# Patient Record
Sex: Female | Born: 1975 | Race: Black or African American | Hispanic: No | Marital: Single | Smoking: Never smoker
Health system: Southern US, Community
[De-identification: ages and names within clinical notes are randomized; demographics above are authoritative.]

## PROBLEM LIST (undated history)

## (undated) DIAGNOSIS — I252 Old myocardial infarction: Secondary | ICD-10-CM

## (undated) DIAGNOSIS — D571 Sickle-cell disease without crisis: Secondary | ICD-10-CM

## (undated) DIAGNOSIS — I2699 Other pulmonary embolism without acute cor pulmonale: Secondary | ICD-10-CM

## (undated) DIAGNOSIS — R569 Unspecified convulsions: Secondary | ICD-10-CM

## (undated) HISTORY — PX: OTHER SURGICAL HISTORY: SHX169

## (undated) HISTORY — PX: CORONARY ANGIOPLASTY WITH STENT PLACEMENT: SHX49

---

## 2012-11-04 DIAGNOSIS — I2510 Atherosclerotic heart disease of native coronary artery without angina pectoris: Secondary | ICD-10-CM | POA: Diagnosis present

## 2014-05-12 DIAGNOSIS — Z8669 Personal history of other diseases of the nervous system and sense organs: Secondary | ICD-10-CM | POA: Insufficient documentation

## 2014-12-03 DIAGNOSIS — G40909 Epilepsy, unspecified, not intractable, without status epilepticus: Secondary | ICD-10-CM

## 2015-09-25 DIAGNOSIS — I10 Essential (primary) hypertension: Secondary | ICD-10-CM | POA: Diagnosis present

## 2016-01-02 DIAGNOSIS — I2699 Other pulmonary embolism without acute cor pulmonale: Secondary | ICD-10-CM | POA: Insufficient documentation

## 2016-09-06 DIAGNOSIS — M459 Ankylosing spondylitis of unspecified sites in spine: Secondary | ICD-10-CM | POA: Insufficient documentation

## 2020-02-22 DIAGNOSIS — R112 Nausea with vomiting, unspecified: Secondary | ICD-10-CM | POA: Insufficient documentation

## 2021-07-20 ENCOUNTER — Observation Stay
Admission: EM | Admit: 2021-07-20 | Discharge: 2021-07-22 | Disposition: A | Discharge status: Home / Self Care | Payer: Managed Care, Other (non HMO) | Attending: Internal Medicine | Admitting: Internal Medicine

## 2021-07-20 ENCOUNTER — Emergency Department: Payer: Managed Care, Other (non HMO)

## 2021-07-20 DIAGNOSIS — Z7289 Other problems related to lifestyle: Secondary | ICD-10-CM | POA: Diagnosis not present

## 2021-07-20 DIAGNOSIS — Z7982 Long term (current) use of aspirin: Secondary | ICD-10-CM | POA: Diagnosis not present

## 2021-07-20 DIAGNOSIS — D649 Anemia, unspecified: Secondary | ICD-10-CM

## 2021-07-20 DIAGNOSIS — N200 Calculus of kidney: Secondary | ICD-10-CM | POA: Diagnosis not present

## 2021-07-20 DIAGNOSIS — R0789 Other chest pain: Secondary | ICD-10-CM | POA: Insufficient documentation

## 2021-07-20 DIAGNOSIS — K259 Gastric ulcer, unspecified as acute or chronic, without hemorrhage or perforation: Principal | ICD-10-CM | POA: Insufficient documentation

## 2021-07-20 DIAGNOSIS — I10 Essential (primary) hypertension: Secondary | ICD-10-CM | POA: Insufficient documentation

## 2021-07-20 DIAGNOSIS — I2510 Atherosclerotic heart disease of native coronary artery without angina pectoris: Secondary | ICD-10-CM | POA: Insufficient documentation

## 2021-07-20 DIAGNOSIS — K2970 Gastritis, unspecified, without bleeding: Secondary | ICD-10-CM | POA: Insufficient documentation

## 2021-07-20 DIAGNOSIS — R079 Chest pain, unspecified: Secondary | ICD-10-CM | POA: Diagnosis present

## 2021-07-20 DIAGNOSIS — I252 Old myocardial infarction: Secondary | ICD-10-CM | POA: Diagnosis not present

## 2021-07-20 DIAGNOSIS — Z86711 Personal history of pulmonary embolism: Secondary | ICD-10-CM | POA: Diagnosis not present

## 2021-07-20 DIAGNOSIS — D509 Iron deficiency anemia, unspecified: Secondary | ICD-10-CM | POA: Insufficient documentation

## 2021-07-20 DIAGNOSIS — Z79899 Other long term (current) drug therapy: Secondary | ICD-10-CM | POA: Diagnosis not present

## 2021-07-20 DIAGNOSIS — Z765 Malingerer [conscious simulation]: Secondary | ICD-10-CM

## 2021-07-20 DIAGNOSIS — G40909 Epilepsy, unspecified, not intractable, without status epilepticus: Secondary | ICD-10-CM

## 2021-07-20 DIAGNOSIS — I2699 Other pulmonary embolism without acute cor pulmonale: Secondary | ICD-10-CM | POA: Diagnosis present

## 2021-07-20 HISTORY — DX: Old myocardial infarction: I25.2

## 2021-07-20 HISTORY — DX: Other pulmonary embolism without acute cor pulmonale: I26.99

## 2021-07-20 HISTORY — DX: Sickle-cell disease without crisis: D57.1

## 2021-07-20 HISTORY — DX: Unspecified convulsions: R56.9

## 2021-07-20 LAB — CBC WITH DIFFERENTIAL/PLATELET
Abs Immature Granulocytes: 0.02 10*3/uL (ref 0.00–0.07)
Basophils Absolute: 0 10*3/uL (ref 0.0–0.1)
Basophils Relative: 1 %
Eosinophils Absolute: 0.1 10*3/uL (ref 0.0–0.5)
Eosinophils Relative: 1 %
HCT: 22.5 % — ABNORMAL LOW (ref 36.0–46.0)
Hemoglobin: 7.4 g/dL — ABNORMAL LOW (ref 12.0–15.0)
Immature Granulocytes: 0 %
Lymphocytes Relative: 24 %
Lymphs Abs: 1.4 10*3/uL (ref 0.7–4.0)
MCH: 25.6 pg — ABNORMAL LOW (ref 26.0–34.0)
MCHC: 32.9 g/dL (ref 30.0–36.0)
MCV: 77.9 fL — ABNORMAL LOW (ref 80.0–100.0)
Monocytes Absolute: 0.5 10*3/uL (ref 0.1–1.0)
Monocytes Relative: 8 %
Neutro Abs: 3.8 10*3/uL (ref 1.7–7.7)
Neutrophils Relative %: 66 %
Platelets: 115 10*3/uL — ABNORMAL LOW (ref 150–400)
RBC: 2.89 MIL/uL — ABNORMAL LOW (ref 3.87–5.11)
RDW: 19.7 % — ABNORMAL HIGH (ref 11.5–15.5)
WBC: 5.9 10*3/uL (ref 4.0–10.5)
nRBC: 0 % (ref 0.0–0.2)

## 2021-07-20 LAB — COMPREHENSIVE METABOLIC PANEL
ALT: 29 U/L (ref 0–44)
AST: 37 U/L (ref 15–41)
Albumin: 3.2 g/dL — ABNORMAL LOW (ref 3.5–5.0)
Alkaline Phosphatase: 101 U/L (ref 38–126)
Anion gap: 5 (ref 5–15)
BUN: 11 mg/dL (ref 6–20)
CO2: 22 mmol/L (ref 22–32)
Calcium: 8.7 mg/dL — ABNORMAL LOW (ref 8.9–10.3)
Chloride: 116 mmol/L — ABNORMAL HIGH (ref 98–111)
Creatinine, Ser: 0.97 mg/dL (ref 0.44–1.00)
GFR, Estimated: >60 mL/min (ref >60)
Glucose, Bld: 98 mg/dL (ref 70–99)
Potassium: 3.6 mmol/L (ref 3.5–5.1)
Sodium: 143 mmol/L (ref 135–145)
Total Bilirubin: 0.8 mg/dL (ref 0.3–1.2)
Total Protein: 6.1 g/dL — ABNORMAL LOW (ref 6.5–8.1)

## 2021-07-20 LAB — LACTIC ACID, PLASMA: Lactic Acid, Venous: 0.9 mmol/L (ref 0.5–1.9)

## 2021-07-20 LAB — PROTIME-INR
INR: 1.1 (ref 0.8–1.2)
Prothrombin Time: 14 seconds (ref 11.4–15.2)

## 2021-07-20 LAB — TROPONIN I (HIGH SENSITIVITY): Troponin I (High Sensitivity): 3 ng/L (ref <18)

## 2021-07-20 LAB — D-DIMER, QUANTITATIVE: D-Dimer, Quant: 0.43 ug/mL-FEU (ref 0.00–0.50)

## 2021-07-20 MED ORDER — SODIUM CHLORIDE 0.9 % IV BOLUS
500.0000 mL | Freq: Once | INTRAVENOUS | Status: AC
  Administered 2021-07-20: 500 mL via INTRAVENOUS

## 2021-07-20 MED ORDER — PANTOPRAZOLE SODIUM 40 MG IV SOLR
40.0000 mg | Freq: Two times a day (BID) | INTRAVENOUS | Status: DC
  Administered 2021-07-20 – 2021-07-22 (×4): 40 mg via INTRAVENOUS
  Filled 2021-07-20 (×4): qty 10

## 2021-07-20 MED ORDER — LEVETIRACETAM IN NACL 1000 MG/100ML IV SOLN
1000.0000 mg | Freq: Once | INTRAVENOUS | Status: AC
Start: 2021-07-20 — End: 2021-07-20
  Administered 2021-07-20: 1000 mg via INTRAVENOUS
  Filled 2021-07-20: qty 100

## 2021-07-20 MED ORDER — SODIUM CHLORIDE 0.9 % IV SOLN
10.0000 mL/h | Freq: Once | INTRAVENOUS | Status: AC
  Administered 2021-07-20: 10 mL/h via INTRAVENOUS

## 2021-07-20 MED ORDER — HYDROMORPHONE HCL 1 MG/ML IJ SOLN
0.5000 mg | Freq: Once | INTRAMUSCULAR | Status: AC
Start: 2021-07-20 — End: 2021-07-20
  Administered 2021-07-20: 0.5 mg via INTRAVENOUS
  Filled 2021-07-20: qty 0.5

## 2021-07-20 MED ORDER — IOHEXOL 300 MG/ML  SOLN
75.0000 mL | Freq: Once | INTRAMUSCULAR | Status: AC | PRN
  Administered 2021-07-20: 75 mL via INTRAVENOUS

## 2021-07-20 MED ORDER — HYDROMORPHONE HCL 1 MG/ML IJ SOLN
0.5000 mg | Freq: Once | INTRAMUSCULAR | Status: AC
  Administered 2021-07-20: 0.5 mg via INTRAVENOUS
  Filled 2021-07-20: qty 0.5

## 2021-07-20 NOTE — ED Provider Notes (Signed)
? ?[facility] ?Provider Note ? ? ? Event Date/Time  ? First MD Initiated Contact with Patient 07/20/21 2120   ?  (approximate) ? ? ?History  ? ?Chest Pain ? ? ?HPI ? ?Tammy Johnston is a 46 y.o. female  with complex PMHx including CAD s/p stenting to LAD, seizures, HTN, chronic/recurrent CP, question of sickle cell anemia, recent kidney stone with R sided stetn in place, here with chest pain. Pt reports that she is driving through town with her husband, who is a Naval architecttruck driver. Reports that today, she experienced acute, left sided CP with some radiation toward her L shoulder. She has had recurrent CP recently and is scheduled to see a Cardiologist at home in [place] soon. She reports she also has had persistent R sided abd/flank pain with nausea, which is also not new and likely related to her stent. Reports that she has fairly frequent CP, but this was worse than usual so she presents for evaluation. She has been taking her meds as prescribed. No fevers.  ? ?  ? ? ?Physical Exam  ? ?Triage Vital Signs: ?ED Triage Vitals  ?Enc Vitals Group  ?   BP   ?   Pulse   ?   Resp   ?   Temp   ?   Temp src   ?   SpO2   ?   Weight   ?   Height   ?   Head Circumference   ?   Peak Flow   ?   Pain Score   ?   Pain Loc   ?   Pain Edu?   ?   Excl. in GC?   ? ? ?Most recent vital signs: ?Vitals:  ? 07/20/21 2300 07/20/21 2345  ?BP: 112/74 (!) 132/97  ?Pulse: 73 77  ?Resp: 20 (!) 21  ?Temp:    ?SpO2: 99% 95%  ? ? ? ?General: Awake, no distress.  ?CV:  Good peripheral perfusion. RRR. No murmurs. ?Resp:  Normal effort. Lungs CTAB. ?Abd:  No distention. Minimal R sided TTP, no rebound, no guarding. No CVAT. ?Other:  Somewhat anxious/uncomfortable appearing. MMM.  ? ? ?ED Results / Procedures / Treatments  ? ?Labs ?(all labs ordered are listed, but only abnormal results are displayed) ?Labs Reviewed  ?CBC WITH DIFFERENTIAL/PLATELET - Abnormal; Notable for the following components:  ?    Result Value  ? RBC 2.89 (*)   ?  Hemoglobin 7.4 (*)   ? HCT 22.5 (*)   ? MCV 77.9 (*)   ? MCH 25.6 (*)   ? RDW 19.7 (*)   ? Platelets 115 (*)   ? All other components within normal limits  ?COMPREHENSIVE METABOLIC PANEL - Abnormal; Notable for the following components:  ? Chloride 116 (*)   ? Calcium 8.7 (*)   ? Total Protein 6.1 (*)   ? Albumin 3.2 (*)   ? All other components within normal limits  ?URINALYSIS, ROUTINE W REFLEX MICROSCOPIC - Abnormal; Notable for the following components:  ? Color, Urine YELLOW (*)   ? APPearance CLOUDY (*)   ? Hgb urine dipstick MODERATE (*)   ? Protein, ur 30 (*)   ? Nitrite POSITIVE (*)   ? Leukocytes,Ua LARGE (*)   ? WBC, UA >50 (*)   ? Bacteria, UA RARE (*)   ? All other components within normal limits  ?IRON AND TIBC - Abnormal; Notable for the following components:  ? Iron 18 (*)   ?  Saturation Ratios 5 (*)   ? All other components within normal limits  ?FERRITIN - Abnormal; Notable for the following components:  ? Ferritin 7 (*)   ? All other components within normal limits  ?RETICULOCYTES - Abnormal; Notable for the following components:  ? RBC. 2.99 (*)   ? Immature Retic Fract 28.6 (*)   ? All other components within normal limits  ?URINE DRUG SCREEN, QUALITATIVE ([facility] ONLY) - Abnormal; Notable for the following components:  ? Tricyclic, Ur Screen POSITIVE (*)   ? Opiate, Ur Screen POSITIVE (*)   ? Benzodiazepine, Ur Scrn POSITIVE (*)   ? All other components within normal limits  ?LACTIC ACID, PLASMA  ?LACTIC ACID, PLASMA  ?D-DIMER, QUANTITATIVE  ?PROTIME-INR  ?FOLATE  ?VITAMIN B12  ?SICKLE CELL SCREEN  ?OCCULT BLOOD X 1 CARD TO LAB, STOOL  ?HIV ANTIBODY (ROUTINE TESTING W REFLEX)  ?TSH  ?T4, FREE  ?LIPID PANEL  ?PREPARE RBC (CROSSMATCH)  ?TYPE AND SCREEN  ?TROPONIN I (HIGH SENSITIVITY)  ?TROPONIN I (HIGH SENSITIVITY)  ?TROPONIN I (HIGH SENSITIVITY)  ? ? ? ?EKG ?Normal sinus rhythm, VR 86. PR 149, QRS 88, QTc 454. No acute St elevations or depressions. No ischemia or infarct. ? ? ?RADIOLOGY ?CXR:  Clear ? ? ?I also independently reviewed and agree with radiologist interpretations. ? ? ?PROCEDURES: ? ?Critical Care performed: No ? ?.1-3 Lead EKG Interpretation ?Performed by: Shaune Pollack, MD ?Authorized by: Shaune Pollack, MD  ? ?  Interpretation: normal   ?  ECG rate:  80-90 ?  ECG rate assessment: normal   ?  Rhythm: sinus rhythm   ?  Ectopy: none   ?  Conduction: normal   ?Comments:  ?   Indication: Chest pain ? ? ? ?MEDICATIONS ORDERED IN ED: ?Medications  ?pantoprazole (PROTONIX) injection 40 mg (40 mg Intravenous Given 07/20/21 2345)  ?sodium chloride flush (NS) 0.9 % injection 3 mL (3 mLs Intravenous Not Given 07/21/21 0043)  ?sodium chloride flush (NS) 0.9 % injection 3 mL (has no administration in time range)  ?0.9 %  sodium chloride infusion (has no administration in time range)  ?aspirin EC tablet 81 mg (has no administration in time range)  ?nitroGLYCERIN (NITROSTAT) SL tablet 0.4 mg (has no administration in time range)  ?atorvastatin (LIPITOR) tablet 40 mg (has no administration in time range)  ?acetaminophen (TYLENOL) tablet 650 mg (has no administration in time range)  ?ondansetron (ZOFRAN) injection 4 mg (has no administration in time range)  ?heparin injection 5,000 Units (has no administration in time range)  ?levETIRAcetam (KEPPRA) IVPB 1000 mg/100 mL premix (0 mg Intravenous Stopped 07/20/21 2144)  ?HYDROmorphone (DILAUDID) injection 0.5 mg (0.5 mg Intravenous Given 07/20/21 2137)  ?sodium chloride 0.9 % bolus 500 mL (0 mLs Intravenous Stopped 07/20/21 2257)  ?HYDROmorphone (DILAUDID) injection 0.5 mg (0.5 mg Intravenous Given 07/20/21 2345)  ?0.9 %  sodium chloride infusion (10 mL/hr Intravenous New Bag/Given 07/20/21 2346)  ?iohexol (OMNIPAQUE) 300 MG/ML solution 75 mL (75 mLs Intravenous Contrast Given 07/20/21 2354)  ? ? ? ?IMPRESSION / MDM / ASSESSMENT AND PLAN / ED COURSE  ?I reviewed the triage vital signs and the nursing notes. ?             ?               ? ? ?The patient is on the  cardiac monitor to evaluate for evidence of arrhythmia and/or significant heart rate changes. ? ? ?Ddx:  ?Differential includes the following, with pertinent  life- or limb-threatening emergencies considered: ? ?ACS, PE, PNA, PTX, MSK chest pain, possible sickle cell related pain?, referred pain from renal stone ? ? ?MDM:  ?46 yo F with complex PMHx including CAD, seizure d/o, nephrolithiasis with renal stent in place, here with chest pain and abd pain. Re: abd pain - suspect this is 2/2 her stent. No fevers or infectious sx. UA pending. Re: chest pain - this is likely multifactorial. Pt has a long, well documented h/o intermittent CP, and has had LAD disease previously as well as chronic pain issues. Pt interestingly states she was just told she had "sickle cell," and Hgb is 7.4 today. No signs of bleeding clinically. This, along with her CP and h/o CAD, merits transfusion and observation for possible symptomatic anemia with cardiac ischemia. EKG shows no ST elevations. Trop negative. Renal function is normal. CXR is clear without focal abnormality. D-Dimer negative and pt is on Xarelto, doubt PE. ? ?Will admit to hospitalist service for transfusion, chest pain observation, pain control. ? ? ? ?MEDICATIONS GIVEN IN ED: ?Medications  ?pantoprazole (PROTONIX) injection 40 mg (40 mg Intravenous Given 07/20/21 2345)  ?sodium chloride flush (NS) 0.9 % injection 3 mL (3 mLs Intravenous Not Given 07/21/21 0043)  ?sodium chloride flush (NS) 0.9 % injection 3 mL (has no administration in time range)  ?0.9 %  sodium chloride infusion (has no administration in time range)  ?aspirin EC tablet 81 mg (has no administration in time range)  ?nitroGLYCERIN (NITROSTAT) SL tablet 0.4 mg (has no administration in time range)  ?atorvastatin (LIPITOR) tablet 40 mg (has no administration in time range)  ?acetaminophen (TYLENOL) tablet 650 mg (has no administration in time range)  ?ondansetron (ZOFRAN) injection 4 mg (has no administration  in time range)  ?heparin injection 5,000 Units (has no administration in time range)  ?levETIRAcetam (KEPPRA) IVPB 1000 mg/100 mL premix (0 mg Intravenous Stopped 07/20/21 2144)  ?HYDROmorphone (DILAUDID) inject

## 2021-07-20 NOTE — H&P (Signed)
?History and Physical  ? ? ?Patient: Tammy Johnston [id number] DOB: 12-Jun-1975 ?DOA: 07/20/2021 ?DOS: the patient was seen and examined on 07/21/2021 ?PCP: Pcp, No  ?Patient coming from: Home ? ?Chief Complaint:  ?Chief Complaint  ?Patient presents with  ? Chest Pain  ? ?HPI: Tammy Johnston is a 46 y.o. female with medical history significant of seizure disorder, CHF, heart disease, history of PE and DVT ankylosing spondylitis,, history of dysarthria with a stroke work-up that was negative for an MRI, chronic anemia.  Patient also has past medical history of epilepsy, pancreatitis, scoliosis, spondylosis. ?Care everywhere shows multiple emergency room visits for several different health systems.   ?Pt takes norco [facility] her [facility] . ?Pt's last colonoscopy was long time ago.  ?Pt has primary doctor [facility], [place] - Dr. Chucky May PA. ?Pt does not know why she has VTE. ?Pt has blood clots / Pt also had PE and is on xarelto.  ?Does not know how long she has been anemic.  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 ?Meds are with husband on truck.  ?Requesting diazepam for stuttering and takes at home along with propranolol. ?>>Chest pain: ?Duration: today.  ?Frequency:intermittent.  ?Location:left side.  ?Quality:sharp.  ?Rate:  9/10  ?Radiation:radiating to her left arm. ?Aggravating: walking  ?Alleviating:resting  ?Associated factors: ?SOB. ?Husband is truck driver and she came in for chest pain.  ?Dr. Wanda Plump horn is urology left ureteral stent. ?Dr. Rueben Bash is cardiology both are in [place].  ?Pt has h/o seizures. ?Pt has known kidney stone on right and is seeing urology for it.  ?Pt has h/o PE / DVT. ? ?Review of Systems  ?Cardiovascular:  Positive for chest pain.  ?Genitourinary:  Positive for flank pain.  ?All other systems reviewed and are negative. ? ?Past Medical History:  ?Diagnosis Date  ? MI, old   ? PE (pulmonary  thromboembolism) ([place])   ? Seizures ([place])   ? Sickle cell anemia ([facility])   ? ?Social History ?reports that she has never smoked. She has never used smokeless tobacco. She reports that she does not drink alcohol and does not use drugs.  ? ?Prior to Admission Medications:  ?I have utilized all available immediate resources to obtain, update, or review the patient's current medications. ?Prior to Admission medications  ?Medication Sig Start Date End Date Taking? Authorizing Provider  ?HYDROcodone-acetaminophen (NORCO) 10-325 mg per tablet Take 1-2 tablets by mouth every 4 (four) hours as needed for mild pain or moderate pain Yes [provider]  ?aspirin delayed release 81 mg tablet Take 1 tablet (81 mg total) by mouth daily with breakfast 06/17/20 06/17/21 Laray Anger., MD  ?AtorvaSTATin (LIPITOR) 40 mg tablet Take 1 tablet (40 mg total) by mouth bedtime 06/16/20 06/16/21 Laray Anger., MD  ?FLUoxetine (PROzac) 40 mg capsule Take 80 mg by mouth daily [provider]  ?imipramine (TOFRANIL) 25 mg tablet Take 25 mg by mouth bedtime [provider]  ?levETIRAcetam (KEPPRA) 750 mg tablet Take 1,500 mg by mouth 2 (two) times a day. [provider]  ?mirtazapine (REMERON) 45 mg tablet Take 45 mg by mouth bedtime [provider]  ?prazosin (MINIPRESS) 5 mg capsule Take 5 mg by mouth bedtime [provider]  ?zolpidem (AMBIEN) 5 mg tablet Take 5 mg by mouth nightly as needed [provider]  ? ?Allergies:  ?is allergic to ketorolac tromethamine, ketorolac, tramadol, ginger,  morphine, and tramadol. ? ? ?Family History  ?Problem Relation Age of Onset  ? Coronary artery disease Mother  ? Cancer Mother  ? ?Physical Exam: ?Vitals:  ? 07/20/21 2200 07/20/21 2230 07/20/21 2300 07/20/21 2345  ?BP: 128/89 130/87 112/74 (!) 132/97  ?Pulse: 89 84 73 77  ?Resp: (!) 24 14 20  (!) 21  ?Temp:      ?TempSrc:      ?SpO2: 100% 100% 99% 95%  ?Weight:      ? ?Physical  Exam ?Vitals and nursing note reviewed.  ?Constitutional:   ?   General: She is not in acute distress. ?   Appearance: Normal appearance. She is not ill-appearing, toxic-appearing or diaphoretic.  ?HENT:  ?   Head: Normocephalic and atraumatic.  ?   Right Ear: Hearing and external ear normal.  ?   Left Ear: Hearing and external ear normal.  ?   Nose: Nose normal. No nasal deformity.  ?   Mouth/Throat:  ?   Lips: Pink.  ?   Mouth: Mucous membranes are moist.  ?   Tongue: No lesions.  ?   Pharynx: Oropharynx is clear.  ?Eyes:  ?   Extraocular Movements: Extraocular movements intact.  ?   Pupils: Pupils are equal, round, and reactive to light.  ?Neck:  ?   Vascular: No carotid bruit.  ?Cardiovascular:  ?   Rate and Rhythm: Normal rate and regular rhythm.  ?   Pulses: Normal pulses.  ?   Heart sounds: Normal heart sounds.  ?Pulmonary:  ?   Effort: Pulmonary effort is normal.  ?   Breath sounds: Normal breath sounds.  ?Abdominal:  ?   General: Bowel sounds are normal. There is no distension.  ?   Palpations: Abdomen is soft. There is no mass.  ?   Tenderness: There is no abdominal tenderness. There is no guarding.  ?   Hernia: No hernia is present.  ?Genitourinary: ?   Rectum: Guaiac result negative.  ?Musculoskeletal:  ?   Right lower leg: No edema.  ?   Left lower leg: No edema.  ?Skin: ?   General: Skin is warm.  ?Neurological:  ?   General: No focal deficit present.  ?   Mental Status: She is alert and oriented to person, place, and time.  ?   Cranial Nerves: Cranial nerves 2-12 are intact. No cranial nerve deficit.  ?   Motor: Motor function is intact. No weakness.  ?Psychiatric:     ?   Attention and Perception: Attention normal.     ?   Mood and Affect: Mood is anxious.     ?   Speech: Speech normal.     ?   Behavior: Behavior is cooperative.     ?   Cognition and Memory: Cognition normal.  ? ? ?Data Reviewed: ?Results for orders placed or performed during the hospital encounter of 07/20/21 (from the past 24  hour(s))  ?CBC with Differential     Status: Abnormal  ? Collection Time: 07/20/21  9:36 PM  ?Result Value Ref Range  ? WBC 5.9 4.0 - 10.5 K/uL  ? RBC 2.89 (L) 3.87 - 5.11 MIL/uL  ? Hemoglobin 7.4 (L) 12.0 - 15.0 g/dL  ? HCT 22.5 (L) 36.0 - 46.0 %  ? MCV 77.9 (L) 80.0 - 100.0 fL  ? MCH 25.6 (L) 26.0 - 34.0 pg  ? MCHC 32.9 30.0 - 36.0 g/dL  ? RDW 19.7 (H) 11.5 - 15.5 %  ?  Platelets 115 (L) 150 - 400 K/uL  ? nRBC 0.0 0.0 - 0.2 %  ? Neutrophils Relative % 66 %  ? Neutro Abs 3.8 1.7 - 7.7 K/uL  ? Lymphocytes Relative 24 %  ? Lymphs Abs 1.4 0.7 - 4.0 K/uL  ? Monocytes Relative 8 %  ? Monocytes Absolute 0.5 0.1 - 1.0 K/uL  ? Eosinophils Relative 1 %  ? Eosinophils Absolute 0.1 0.0 - 0.5 K/uL  ? Basophils Relative 1 %  ? Basophils Absolute 0.0 0.0 - 0.1 K/uL  ? Immature Granulocytes 0 %  ? Abs Immature Granulocytes 0.02 0.00 - 0.07 K/uL  ?Comprehensive metabolic panel     Status: Abnormal  ? Collection Time: 07/20/21  9:36 PM  ?Result Value Ref Range  ? Sodium 143 135 - 145 mmol/L  ? Potassium 3.6 3.5 - 5.1 mmol/L  ? Chloride 116 (H) 98 - 111 mmol/L  ? CO2 22 22 - 32 mmol/L  ? Glucose, Bld 98 70 - 99 mg/dL  ? BUN 11 6 - 20 mg/dL  ? Creatinine, Ser 0.97 0.44 - 1.00 mg/dL  ? Calcium 8.7 (L) 8.9 - 10.3 mg/dL  ? Total Protein 6.1 (L) 6.5 - 8.1 g/dL  ? Albumin 3.2 (L) 3.5 - 5.0 g/dL  ? AST 37 15 - 41 U/L  ? ALT 29 0 - 44 U/L  ? Alkaline Phosphatase 101 38 - 126 U/L  ? Total Bilirubin 0.8 0.3 - 1.2 mg/dL  ? GFR, Estimated >60 >60 mL/min  ? Anion gap 5 5 - 15  ?Lactic acid, plasma     Status: None  ? Collection Time: 07/20/21  9:36 PM  ?Result Value Ref Range  ? Lactic Acid, Venous 0.9 0.5 - 1.9 mmol/L  ?Troponin I (High Sensitivity)     Status: None  ? Collection Time: 07/20/21  9:36 PM  ?Result Value Ref Range  ? Troponin I (High Sensitivity) 3 <18 ng/L  ?D-dimer, quantitative     Status: None  ? Collection Time: 07/20/21  9:36 PM  ?Result Value Ref Range  ? D-Dimer, Quant 0.43 0.00 - 0.50 ug/mL-FEU  ?Protime-INR     Status:  None  ? Collection Time: 07/20/21  9:36 PM  ?Result Value Ref Range  ? Prothrombin Time 14.0 11.4 - 15.2 seconds  ? INR 1.1 0.8 - 1.2  ?CT scan chest with contrast: ?IMPRESSION: ?1. Mild right basilar linear atelectasis. ?

## 2021-07-20 NOTE — ED Triage Notes (Signed)
Pt arrived via EMS [place]. Pt sts that she has been having chest pain all day with radiating pain to the left arm. EMS administered nitro spray with nitro paste and aspirin. Pt still rates 8/10 chest pain. Pt has had four previous MI with stent placements to include the left LAD. ?

## 2021-07-21 ENCOUNTER — Other Ambulatory Visit: Payer: Self-pay

## 2021-07-21 ENCOUNTER — Observation Stay (HOSPITAL_BASED_OUTPATIENT_CLINIC_OR_DEPARTMENT_OTHER)
Admit: 2021-07-21 | Discharge: 2021-07-21 | Disposition: A | Discharge status: Home / Self Care | Payer: Managed Care, Other (non HMO) | Attending: Internal Medicine | Admitting: Internal Medicine

## 2021-07-21 ENCOUNTER — Encounter: Payer: Self-pay | Admitting: Internal Medicine

## 2021-07-21 ENCOUNTER — Observation Stay: Payer: Managed Care, Other (non HMO)

## 2021-07-21 DIAGNOSIS — I2510 Atherosclerotic heart disease of native coronary artery without angina pectoris: Secondary | ICD-10-CM

## 2021-07-21 DIAGNOSIS — R079 Chest pain, unspecified: Secondary | ICD-10-CM | POA: Diagnosis not present

## 2021-07-21 DIAGNOSIS — I2699 Other pulmonary embolism without acute cor pulmonale: Secondary | ICD-10-CM | POA: Diagnosis present

## 2021-07-21 DIAGNOSIS — I10 Essential (primary) hypertension: Secondary | ICD-10-CM

## 2021-07-21 DIAGNOSIS — R0789 Other chest pain: Secondary | ICD-10-CM

## 2021-07-21 DIAGNOSIS — D508 Other iron deficiency anemias: Secondary | ICD-10-CM | POA: Diagnosis not present

## 2021-07-21 DIAGNOSIS — G40909 Epilepsy, unspecified, not intractable, without status epilepticus: Secondary | ICD-10-CM

## 2021-07-21 LAB — URINALYSIS, ROUTINE W REFLEX MICROSCOPIC
Bilirubin Urine: NEGATIVE
Glucose, UA: NEGATIVE mg/dL
Ketones, ur: NEGATIVE mg/dL
Nitrite: POSITIVE — AB
Protein, ur: 30 mg/dL — AB
Specific Gravity, Urine: 1.021 (ref 1.005–1.030)
WBC, UA: >50 WBC/hpf — ABNORMAL HIGH (ref 0–5)
pH: 6 (ref 5.0–8.0)

## 2021-07-21 LAB — CBC
HCT: 31.7 % — ABNORMAL LOW (ref 36.0–46.0)
Hemoglobin: 10.7 g/dL — ABNORMAL LOW (ref 12.0–15.0)
MCH: 26.2 pg (ref 26.0–34.0)
MCHC: 33.8 g/dL (ref 30.0–36.0)
MCV: 77.5 fL — ABNORMAL LOW (ref 80.0–100.0)
Platelets: 111 10*3/uL — ABNORMAL LOW (ref 150–400)
RBC: 4.09 MIL/uL (ref 3.87–5.11)
RDW: 18.5 % — ABNORMAL HIGH (ref 11.5–15.5)
WBC: 7.8 10*3/uL (ref 4.0–10.5)
nRBC: 0 % (ref 0.0–0.2)

## 2021-07-21 LAB — IRON AND TIBC
Iron: 18 ug/dL — ABNORMAL LOW (ref 28–170)
Saturation Ratios: 5 % — ABNORMAL LOW (ref 10.4–31.8)
TIBC: 398 ug/dL (ref 250–450)
UIBC: 380 ug/dL

## 2021-07-21 LAB — ECHOCARDIOGRAM COMPLETE
AR max vel: 2.11 cm2
AV Area VTI: 1.89 cm2
AV Area mean vel: 2.03 cm2
AV Mean grad: 3 mmHg
AV Peak grad: 4.7 mmHg
Ao pk vel: 1.08 m/s
Area-P 1/2: 3.51 cm2
MV VTI: 2.25 cm2
S' Lateral: 2.84 cm
Weight: 2208 oz

## 2021-07-21 LAB — TROPONIN I (HIGH SENSITIVITY)
Troponin I (High Sensitivity): 3 ng/L (ref <18)
Troponin I (High Sensitivity): 3 ng/L (ref <18)
Troponin I (High Sensitivity): 3 ng/L (ref <18)

## 2021-07-21 LAB — FOLATE: Folate: 9.6 ng/mL (ref >5.9)

## 2021-07-21 LAB — RETICULOCYTES
Immature Retic Fract: 28.6 % — ABNORMAL HIGH (ref 2.3–15.9)
RBC.: 2.99 MIL/uL — ABNORMAL LOW (ref 3.87–5.11)
Retic Count, Absolute: 44.3 10*3/uL (ref 19.0–186.0)
Retic Ct Pct: 1.5 % (ref 0.4–3.1)

## 2021-07-21 LAB — LIPID PANEL
Cholesterol: 223 mg/dL — ABNORMAL HIGH (ref 0–200)
HDL: 58 mg/dL (ref >40)
LDL Cholesterol: 147 mg/dL — ABNORMAL HIGH (ref 0–99)
Total CHOL/HDL Ratio: 3.8 RATIO
Triglycerides: 90 mg/dL (ref <150)
VLDL: 18 mg/dL (ref 0–40)

## 2021-07-21 LAB — LACTIC ACID, PLASMA: Lactic Acid, Venous: 1 mmol/L (ref 0.5–1.9)

## 2021-07-21 LAB — URINE DRUG SCREEN, QUALITATIVE (ARMC ONLY)
Amphetamines, Ur Screen: NOT DETECTED
Barbiturates, Ur Screen: NOT DETECTED
Benzodiazepine, Ur Scrn: POSITIVE — AB
Cannabinoid 50 Ng, Ur ~~LOC~~: NOT DETECTED
Cocaine Metabolite,Ur ~~LOC~~: NOT DETECTED
MDMA (Ecstasy)Ur Screen: NOT DETECTED
Methadone Scn, Ur: NOT DETECTED
Opiate, Ur Screen: POSITIVE — AB
Phencyclidine (PCP) Ur S: NOT DETECTED
Tricyclic, Ur Screen: POSITIVE — AB

## 2021-07-21 LAB — PREPARE RBC (CROSSMATCH)

## 2021-07-21 LAB — ABO/RH: ABO/RH(D): B POS

## 2021-07-21 LAB — TSH: TSH: 0.647 u[IU]/mL (ref 0.350–4.500)

## 2021-07-21 LAB — T4, FREE: Free T4: 0.68 ng/dL (ref 0.61–1.12)

## 2021-07-21 LAB — HIV ANTIBODY (ROUTINE TESTING W REFLEX): HIV Screen 4th Generation wRfx: NONREACTIVE

## 2021-07-21 LAB — FERRITIN: Ferritin: 7 ng/mL — ABNORMAL LOW (ref 11–307)

## 2021-07-21 LAB — VITAMIN B12: Vitamin B-12: 330 pg/mL (ref 180–914)

## 2021-07-21 MED ORDER — LEVETIRACETAM 500 MG PO TABS
1500.0000 mg | ORAL_TABLET | Freq: Two times a day (BID) | ORAL | Status: DC
  Administered 2021-07-21 – 2021-07-22 (×3): 1500 mg via ORAL
  Filled 2021-07-21 (×3): qty 3

## 2021-07-21 MED ORDER — PEG 3350-KCL-NA BICARB-NACL 420 G PO SOLR
4000.0000 mL | Freq: Once | ORAL | Status: AC
  Administered 2021-07-21: 4000 mL via ORAL
  Filled 2021-07-21: qty 4000

## 2021-07-21 MED ORDER — SODIUM CHLORIDE 0.9% FLUSH
3.0000 mL | Freq: Two times a day (BID) | INTRAVENOUS | Status: DC
  Administered 2021-07-21 – 2021-07-22 (×2): 3 mL via INTRAVENOUS

## 2021-07-21 MED ORDER — ATORVASTATIN CALCIUM 20 MG PO TABS
40.0000 mg | ORAL_TABLET | Freq: Every day | ORAL | Status: DC
  Filled 2021-07-21: qty 2

## 2021-07-21 MED ORDER — DIAZEPAM 5 MG PO TABS
5.0000 mg | ORAL_TABLET | Freq: Once | ORAL | Status: AC
  Administered 2021-07-21: 5 mg via ORAL
  Filled 2021-07-21: qty 1

## 2021-07-21 MED ORDER — DIPHENHYDRAMINE HCL 50 MG/ML IJ SOLN
25.0000 mg | Freq: Once | INTRAMUSCULAR | Status: DC | PRN
  Filled 2021-07-21 (×2): qty 1

## 2021-07-21 MED ORDER — NITROGLYCERIN 0.4 MG SL SUBL: 0.4000 mg | SUBLINGUAL_TABLET | SUBLINGUAL | Status: DC | PRN

## 2021-07-21 MED ORDER — RIVAROXABAN 10 MG PO TABS
10.0000 mg | ORAL_TABLET | Freq: Every day | ORAL | Status: DC
  Filled 2021-07-21: qty 1

## 2021-07-21 MED ORDER — HEPARIN SODIUM (PORCINE) 5000 UNIT/ML IJ SOLN: 5000.0000 [IU] | Freq: Three times a day (TID) | INTRAMUSCULAR | Status: DC

## 2021-07-21 MED ORDER — ONDANSETRON HCL 4 MG/2ML IJ SOLN: 4.0000 mg | Freq: Four times a day (QID) | INTRAMUSCULAR | Status: DC | PRN

## 2021-07-21 MED ORDER — PRAZOSIN HCL 5 MG PO CAPS
5.0000 mg | ORAL_CAPSULE | Freq: Every day | ORAL | Status: DC
  Administered 2021-07-21: 5 mg via ORAL
  Filled 2021-07-21: qty 1

## 2021-07-21 MED ORDER — QUETIAPINE FUMARATE ER 300 MG PO TB24
300.0000 mg | ORAL_TABLET | Freq: Every day | ORAL | Status: DC
  Administered 2021-07-21: 300 mg via ORAL
  Filled 2021-07-21: qty 1

## 2021-07-21 MED ORDER — ZOLPIDEM TARTRATE 5 MG PO TABS
5.0000 mg | ORAL_TABLET | Freq: Once | ORAL | Status: AC
  Administered 2021-07-21: 5 mg via ORAL
  Filled 2021-07-21: qty 1

## 2021-07-21 MED ORDER — ACETAMINOPHEN 325 MG PO TABS: 650.0000 mg | ORAL_TABLET | ORAL | Status: DC | PRN

## 2021-07-21 MED ORDER — FLUOXETINE HCL 20 MG PO CAPS
80.0000 mg | ORAL_CAPSULE | Freq: Every day | ORAL | Status: DC
  Administered 2021-07-21 – 2021-07-22 (×2): 80 mg via ORAL
  Filled 2021-07-21 (×2): qty 4

## 2021-07-21 MED ORDER — HYDROMORPHONE HCL 1 MG/ML IJ SOLN
0.5000 mg | Freq: Once | INTRAMUSCULAR | Status: AC
  Administered 2021-07-21: 0.5 mg via INTRAVENOUS
  Filled 2021-07-21: qty 0.5

## 2021-07-21 MED ORDER — KETOROLAC TROMETHAMINE 15 MG/ML IJ SOLN
15.0000 mg | Freq: Once | INTRAMUSCULAR | Status: AC
  Administered 2021-07-21: 15 mg via INTRAVENOUS
  Filled 2021-07-21: qty 1

## 2021-07-21 MED ORDER — HYDROCODONE-ACETAMINOPHEN 10-325 MG PO TABS
1.0000 | ORAL_TABLET | Freq: Four times a day (QID) | ORAL | Status: DC | PRN
  Administered 2021-07-21 (×3): 1 via ORAL
  Filled 2021-07-21 (×3): qty 1

## 2021-07-21 MED ORDER — SODIUM CHLORIDE 0.9% FLUSH: 3.0000 mL | INTRAVENOUS | Status: DC | PRN

## 2021-07-21 MED ORDER — SODIUM CHLORIDE 0.9 % IV SOLN: 250.0000 mL | INTRAVENOUS | Status: DC | PRN

## 2021-07-21 MED ORDER — ASPIRIN EC 81 MG PO TBEC
81.0000 mg | DELAYED_RELEASE_TABLET | Freq: Every day | ORAL | Status: DC
  Administered 2021-07-21 – 2021-07-22 (×2): 81 mg via ORAL
  Filled 2021-07-21 (×2): qty 1

## 2021-07-21 MED ORDER — HYDROCODONE-ACETAMINOPHEN 10-325 MG PO TABS
1.0000 | ORAL_TABLET | ORAL | Status: DC | PRN
  Administered 2021-07-21: 1 via ORAL
  Administered 2021-07-22 (×3): 2 via ORAL
  Filled 2021-07-21: qty 2
  Filled 2021-07-21: qty 1
  Filled 2021-07-21 (×2): qty 2

## 2021-07-21 NOTE — Progress Notes (Signed)
? ?  ?Name: Rene KocherRegina Fukuda ?MRN: [id number] ?DOB: 09/18/1975 ? ?                                                                                                              CROSS COVER NOTE ?  ?Secure chat received from nursing "I told pt you are busy and are the only doctor for the entire hospital tonight, but pt states she would still like to speak to you, as she would like to have dilaudid because she has gallstones and this is the only medication that has worked for her pain. She says if you can't come up you can call her on her room phone " ?  ?M(r)s Kunin is a 46 yo F who [facility] ED on 07[facility]21 with reports of chest pain and RUQ abdominal pain. PMH seizure disorder, CHF, heart disease, hx of PE and DVT, ankylosing spondylitis, history of dysarthria, epilepsy, pancreatitis, scoliosis, spondylosis, and chronic anemia. Tonight patient is reporting 8/10 RUQ and (R) flank pain that is sharp and made worse with movement. Patient states she has kidney stones and is currently experiencing a flare of ankylosing spondylitis. She reports her pain is unrelieved by Norco. ? ?On review of CareEverywhere patient has been reporting similar pain since April 28 2021 at which time she presented to an [facility] facility in [place]. Ultrasound there showed no evidence of gallstones. On June 08 2021 she presented to a [facility] facility reporting RUQ pain and chest pain and left AMA. During this visit she reported she was told she had kidney stones during an ED visit 2 days prior in [place]. [facility] that on her review of the imaging from the [place] visit there was no evidence of any acute emergent pathology.  ? ?On initial interview of patient tonight I confirmed all listed allergies with patient and asked if she had any additional allergies which at that time she denied. After CT Abd/pelvis resulted I went back to patient's room and discussed the CT findings. I explained that at  this time there are no clinical findings to support escalation of pain regimen to IV dilaudid but I was happy to offer Oxycodone as an alternative to Norco, as well as lidocaine patches, and supportive therapy. At this time M(r)s Munshi reports she is unable to tolerate Oxycodone and that it causes her to pass out and causes hallucinations. She now reports she is not allergic to Toradol but it would not be effective in managing her current pain. M(r)s Markin reports only IV fentanyl, IV dilaudid, or IV morphine with IV benadryl will provide pain relief. ? ? ? ?Today's Vitals  ? 07/21/21 1933 07/21/21 2013 07/21/21 2015 07/21/21 2058  ?BP:   117/77   ?Pulse:   75   ?Resp:   18   ?Temp:   98.1 ?F (36.7 ?C)   ?TempSrc:   Oral   ?SpO2:   99%   ?Weight:      ?PainSc: 8  8   8    ? ?  There is no height or weight on file to calculate BMI. ? ?CBC ?   ?Component Value Date/Time  ? WBC 7.8 07/21/2021 1610  ? RBC 4.09 07/21/2021 1610  ? HGB 10.7 (L) 07/21/2021 1610  ? HCT 31.7 (L) 07/21/2021 1610  ? PLT 111 (L) 07/21/2021 1610  ? MCV 77.5 (L) 07/21/2021 1610  ? MCH 26.2 07/21/2021 1610  ? MCHC 33.8 07/21/2021 1610  ? RDW 18.5 (H) 07/21/2021 1610  ? LYMPHSABS 1.4 07/20/2021 2136  ? MONOABS 0.5 07/20/2021 2136  ? EOSABS 0.1 07/20/2021 2136  ? BASOSABS 0.0 07/20/2021 2136  ? ? ?  Latest Ref Rng & Units 07/20/2021  ?  9:36 PM  ?BMP  ?Glucose 70 - 99 mg/dL 98    ?BUN 6 - 20 mg/dL 11    ?Creatinine 0.44 - 1.00 mg/dL 1.61    ?Sodium 135 - 145 mmol/L 143    ?Potassium 3.5 - 5.1 mmol/L 3.6    ?Chloride 98 - 111 mmol/L 116    ?CO2 22 - 32 mmol/L 22    ?Calcium 8.9 - 10.3 mg/dL 8.7    ? ?Cardiac Panel (last 3 results) ?Recent Labs  ?  07/20/21 ?2322 07/21/21 ?0617 07/21/21 ?1610  ?TROPONINIHS 3 3 3   ? ?CT ABDOMEN PELVIS WO CONTRAST ? ?Result Date: 07/21/2021 ?CLINICAL DATA:  Flank pain EXAM: CT ABDOMEN AND PELVIS WITHOUT CONTRAST TECHNIQUE: Multidetector CT imaging of the abdomen and pelvis was performed following the standard protocol  without IV contrast. RADIATION DOSE REDUCTION: This exam was performed according to the departmental dose-optimization program which includes automated exposure control, adjustment of the mA and/or kV according to patient size and/or use of iterative reconstruction technique. COMPARISON:  None Available. FINDINGS: Lower chest: Mild linear scarring/atelectasis in the right lower lobe. Hepatobiliary: Unenhanced liver is unremarkable. Vicarious excretion of contrast in the gallbladder (series 2/image 26). No intrahepatic or extrahepatic ductal dilatation. Pancreas: Within normal limits. Spleen: Within normal limits. Adrenals/Urinary Tract: Adrenal glands are within normal limits. Kidneys are within normal limits. No hydronephrosis. Right double-pigtail ureteral stent. Proximal pigtail is in the proximal ureter at the L3 level. Distal pigtail satisfactorily positioned in the bladder. Calcification adjacent to the right ureter/ureteral stent is favored to be a pelvic phlebolith (series 2/image 52). Additional phleboliths on image 58 and in the pelvis bilaterally. Bladder is within normal limits. Stomach/Bowel: Stomach is within normal limits. No evidence of bowel obstruction. Normal appendix (series 2/image 81). No colonic wall thickening or inflammatory changes. Vascular/Lymphatic: No evidence of abdominal aortic aneurysm. No suspicious abdominopelvic lymphadenopathy. Reproductive: Uterus is within normal limits. No adnexal masses. Other: Small volume pelvic ascites. Musculoskeletal: Visualized osseous structures are within normal limits. IMPRESSION: Low-lying right ureteral stent. Proximal pigtail is in the proximal ureter at the L3 level. No hydronephrosis. Electronically Signed   By: 07/23/2021 M.D.   On: 07/21/2021 21:47  ? ?CT CHEST W CONTRAST ? ?Result Date: 07/21/2021 ?CLINICAL DATA:  Chest pain. EXAM: CT CHEST WITH CONTRAST TECHNIQUE: Multidetector CT imaging of the chest was performed during intravenous  contrast administration. RADIATION DOSE REDUCTION: This exam was performed according to the departmental dose-optimization program which includes automated exposure control, adjustment of the mA and/or kV according to patient size and/or use of iterative reconstruction technique. CONTRAST:  12mL OMNIPAQUE IOHEXOL 300 MG/ML  SOLN COMPARISON:  None Available. FINDINGS: Cardiovascular: No significant vascular findings. Normal heart size. A coronary artery stent is in place. A trace amount of pericardial fluid is seen. Mediastinum/Nodes: No enlarged  mediastinal, hilar, or axillary lymph nodes. Thyroid gland, trachea, and esophagus demonstrate no significant findings. Lungs/Pleura: Mild linear atelectasis is seen within the right lung base. There is no evidence of acute infiltrate, pleural effusion or pneumothorax. Upper Abdomen: No acute abnormality. Musculoskeletal: No chest wall abnormality. No acute or significant osseous findings. IMPRESSION: 1. Mild right basilar linear atelectasis. 2. Trace amount of pericardial fluid. Electronically Signed   By: Aram Candela M.D.   On: 07/21/2021 00:18  ? ?ECHOCARDIOGRAM COMPLETE ? ?Result Date: 07/21/2021 ?   ECHOCARDIOGRAM REPORT   Patient Name:   NEYDA DURANGO Date of Exam: 07/21/2021 Medical Rec #:  [id number]      Height: Accession #:    [id number]     Weight:       138.0 lb Date of Birth:  January 20, 1976      BSA:          1.577 m? Patient Age:    45 years       BP:           135/82 mmHg Patient Gender: F              HR:           62 bpm. Exam Location:  [facility] Procedure: 2D Echo, Cardiac Doppler and Color Doppler Indications:     R07.9 Chest Pain  History:         Patient has no prior history of Echocardiogram examinations.                  CAD and Previous Myocardial Infarction, Cardiac stent, Renal                  artery stent; Risk Factors:Hypertension.  Sonographer:     Ceasar Mons Referring Phys:  [id number] Eliezer Mccoy PATEL Diagnosing Phys: Lorine Bears MD  Sonographer  Comments: Suboptimal apical window. Image acquisition challenging due to respiratory motion. IMPRESSIONS  1. Left ventricular ejection fraction, by estimation, is 60 to 65%. The left ventricle has normal fu

## 2021-07-21 NOTE — ED Notes (Signed)
Patient complaining of generalized pain from her "AS."  Requesting her normal PRN medication.  No new complaints of chest pain.  After medicating patient, patient requesting her "night medication because I can't sleep."  Per patient she normally takes Ambien.  ?

## 2021-07-21 NOTE — Assessment & Plan Note (Signed)
H/o pe and on xarelto which we will continue. ?

## 2021-07-21 NOTE — Consult Note (Addendum)
? ?Hematology/Oncology Consult note ?[facility] ?Telephone:([phone] [phone]) [phone] ? ?Patient Care Team: ?Pcp, No as PCP - General  ? ?Name of the patient: Tammy Johnston  ?[id number]  ?07/26/1975  ? ? ?Reason for consult: Anemia and possible history of sickle cell disease ?  ?Requesting physician: Dr. Lynn ItoAmery Sahar ? ?Date of visit: 07/21/2021 ? ? ? ?History of presenting illness-patient is a 46 year old female with questionable history of sickle cell anemia although I do not see any of those recordsUnder Care Everywhere or labs to confirm the same.  She has had multiple ER visits in the past for chest pain and presently admitted with symptoms of chest discomfort.  She has a history of coronary artery disease s/p PCI and stent placement to LAD in 2012, history of pulmonary embolism in 2017  on anticoagulation, history of seizure disorder anxiety and depression.  Patient is from [place] and has received most of her care there.  She was driving with her husband who is a Naval architecttruck driver.  On admission patient found to have white cell count of 5.9, H&H of 7.4/22.5 with an MCV of 77 and a platelet count of 115.  Iron studies showed low iron saturation of 5% and a ferritin level of 7.  Folate and B12 are normal.  Her prior labs from care everywhere show that her MCV in the past has been normal and her hemoglobin ranges at baseline between 9-10.  Denies any consistent use of NSAIDs.  Denies any blood loss in her stool or urine.  She has had a hysterectomy in the past. ? ?Patient currently reports on and off chest pain.  States that she was told she has possible sickle cell disease versus sickle cell trait although she is not on any medications like Hydrea or folic acid and I do not see any prior documentation of sickle cell disease in the records ? ? ?Pain scale:3 ? ? ?Review of systems- Review of Systems  ?Constitutional:  Negative for chills, fever, malaise/fatigue and weight loss.  ?HENT:   Negative for congestion, ear discharge and nosebleeds.   ?Eyes:  Negative for blurred vision.  ?Respiratory:  Negative for cough, hemoptysis, sputum production, shortness of breath and wheezing.   ?Cardiovascular:  Positive for chest pain. Negative for palpitations, orthopnea and claudication.  ?Gastrointestinal:  Negative for abdominal pain, blood in stool, constipation, diarrhea, heartburn, melena, nausea and vomiting.  ?Genitourinary:  Negative for dysuria, flank pain, frequency, hematuria and urgency.  ?Musculoskeletal:  Negative for back pain, joint pain and myalgias.  ?Skin:  Negative for rash.  ?Neurological:  Negative for dizziness, tingling, focal weakness, seizures, weakness and headaches.  ?Endo/Heme/Allergies:  Does not bruise/bleed easily.  ?Psychiatric/Behavioral:  Negative for depression and suicidal ideas. The patient does not have insomnia.   ? ?Allergies  ?Allergen Reactions  ? Ketorolac Tromethamine Hives  ? Morphine Hives, Itching, Rash and Other (See Comments)  ?  Other reaction(s): Other, Other (See Comments), Unknown ?Pt states morphine has to be given with benadryl ?Other reaction(s): "HAS TO BE GIVEN WITH BENADRYL" ?Hives ?Can be given with benadryl ?Localized site itching during administration ?Localized site itching during administration ?Localized redness ?Pt reports her mother informed her she had a reaction to morphine in the past after hysterectomy. Pt told by mother her throat closed.  ?Other reaction(s): Other (See Comments) ?Localized redness ?Localized site itching during administration ?Pt reports her mother informed her she had a reaction to morphine in the past after hysterectomy. Pt told by  mother her throat closed.  ?Localized site itching during administration ?hives ?Hives ?Can be given with benadryl ?Localized site itching during administration ?Localized redness ?Localized site itching during administration ?Pt reports her mother informed her she had a reaction to morphine  in the past after hysterectomy. Pt told by mother her throat closed.  ?Other reaction(s): Other (See Comments) ?Localized redness ?Localized site itching during administration ?Pt reports her mother informed her she had a reaction to morphine in the past after hysterectomy. Pt told by mother her throat closed.  ?Localized site itching during administration ?hives ?  ? Tramadol Hives and Rash  ?  Other reaction(s): Hallucinations, Unknown, Unknown ?Hallucinations ?hives ?Hallucinations ?Hallucinations ?hives ?Hallucinations ?  ? Fentanyl   ?  hives  ? Ketorolac Hives  ?  Other reaction(s): Unknown ?hives, From TORADOL ?  ? Tramadol-Acetaminophen   ?  Other reaction(s): Other (see comments) ?Hallucinations  ? Ginger Hives and Other (See Comments)  ?  Other reaction(s): Other (see comments), Unknown ?Other reaction(s): Unknown ?Other reaction(s): Unknown ?Other reaction(s): Unknown ?  ? ? ?Patient Active Problem List  ? Diagnosis Date Noted  ? Chest pain 07/21/2021  ? PE (pulmonary thromboembolism) (HCC) 07/21/2021  ? Intractable nausea and vomiting 02/22/2020  ? Ankylosing spondylitis (HCC) 09/06/2016  ? Acute pulmonary embolism (HCC) 01/02/2016  ? HTN (hypertension) 09/25/2015  ? Seizure disorder (HCC) 12/03/2014  ? History of seizure disorder 05/12/2014  ? Atherosclerotic heart disease of native coronary artery without angina pectoris 11/04/2012  ? ? ? ?Past Medical History:  ?Diagnosis Date  ? MI, old   ? PE (pulmonary thromboembolism) (HCC)   ? Seizures (HCC)   ? Sickle cell anemia (HCC)   ? ? ? ?Past Surgical History:  ?Procedure Laterality Date  ? CORONARY ANGIOPLASTY WITH STENT PLACEMENT    ? kidney stent    ? ? ?Social History  ? ?Socioeconomic History  ? Marital status: Single  ?  Spouse name: Not on file  ? Number of children: Not on file  ? Years of education: Not on file  ? Highest education level: Not on file  ?Occupational History  ? Not on file  ?Tobacco Use  ? Smoking status: Never  ? Smokeless  tobacco: Never  ?Substance and Sexual Activity  ? Alcohol use: Not on file  ? Drug use: Never  ? Sexual activity: Not on file  ?Other Topics Concern  ? Not on file  ?Social History Narrative  ? Not on file  ? ?Social Determinants of Health  ? ?Financial Resource Strain: Not on file  ?Food Insecurity: Not on file  ?Transportation Needs: Not on file  ?Physical Activity: Not on file  ?Stress: Not on file  ?Social Connections: Not on file  ?Intimate Partner Violence: Not on file  ? ?  ?Family History  ?Problem Relation Age of Onset  ? Breast cancer Mother   ? Heart disease Mother   ? ? ? ?Current Facility-Administered Medications:  ?  0.9 %  sodium chloride infusion, 250 mL, Intravenous, PRN, Gertha Calkin, MD ?  acetaminophen (TYLENOL) tablet 650 mg, 650 mg, Oral, Q4H PRN, Gertha Calkin, MD ?  aspirin EC tablet 81 mg, 81 mg, Oral, Daily, Gertha Calkin, MD, 81 mg at 07/21/21 7341 ?  atorvastatin (LIPITOR) tablet 40 mg, 40 mg, Oral, q1800, Gertha Calkin, MD ?  FLUoxetine (PROZAC) capsule 80 mg, 80 mg, Oral, Daily, Foust, Katy L, NP, 80 mg at 07/21/21 0929 ?  HYDROcodone-acetaminophen (NORCO)  10-325 MG per tablet 1 tablet, 1 tablet, Oral, Q6H PRN, Foust, Katy L, NP, 1 tablet at 07/21/21 1020 ?  levETIRAcetam (KEPPRA) tablet 1,500 mg, 1,500 mg, Oral, BID, Foust, Katy L, NP, 1,500 mg at 07/21/21 7829 ?  nitroGLYCERIN (NITROSTAT) SL tablet 0.4 mg, 0.4 mg, Sublingual, Q5 Min x 3 PRN, Gertha Calkin, MD ?  ondansetron (ZOFRAN) injection 4 mg, 4 mg, Intravenous, Q6H PRN, Gertha Calkin, MD ?  pantoprazole (PROTONIX) injection 40 mg, 40 mg, Intravenous, Q12H, Gertha Calkin, MD, 40 mg at 07/21/21 1204 ?  polyethylene glycol-electrolytes (NuLYTELY) solution 4,000 mL, 4,000 mL, Oral, Once, [place], Michele Rockers, NP ?  prazosin (MINIPRESS) capsule 5 mg, 5 mg, Oral, QHS, Foust, Katy L, NP ?  QUEtiapine (SEROQUEL XR) 24 hr tablet 300 mg, 300 mg, Oral, QHS, Foust, Katy L, NP ?  rivaroxaban (XARELTO) tablet 10 mg, 10 mg, Oral, Daily,  Foust, Katy L, NP ?  sodium chloride flush (NS) 0.9 % injection 3 mL, 3 mL, Intravenous, Q12H, Patel, Ekta V, MD ?  sodium chloride flush (NS) 0.9 % injection 3 mL, 3 mL, Intravenous, PRN, Gertha Calkin, MD ? ?Cu

## 2021-07-21 NOTE — ED Notes (Signed)
Blood reached pt at this time.  ?

## 2021-07-21 NOTE — Assessment & Plan Note (Signed)
Seizure precaution.  ?Pt given keppra loading in ed and resume home meds once med rec available.  ? ?

## 2021-07-21 NOTE — Assessment & Plan Note (Addendum)
D/d include cad and we will get echo  And cardiology has been consulted with am message.  ?ekg sinus rhythm no at t wave changes.  ? ?D/D also include GERD/PUD related chest pain.  ?Gi consult as deemed appropriate and if h/h drop.  ?

## 2021-07-21 NOTE — ED Notes (Signed)
Spoke to Dr. Marylu Lund. No dilaudid orders going forward.  ?

## 2021-07-21 NOTE — ED Notes (Addendum)
Occult stool card preformed with MD at bedside.  Negative for blood.  ?

## 2021-07-21 NOTE — ED Notes (Signed)
Informed RN bed assigned 

## 2021-07-21 NOTE — Consult Note (Signed)
? ? ? ?Cardiology Consultation:  ? ?Patient ID: Tammy Johnston; [id number]; 1975-05-19  ? ?Admit date: 07/20/2021 ?Date of Consult: 07/21/2021 ? ?Primary Care Provider: Pcp, No ?Primary Cardiologist: New to [facility] - Dr. Grayland Jack in [place] ?Primary Electrophysiologist:  None ? ? ?Patient Profile:  ? ?Tammy Johnston is a 46 y.o. 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 Allena Katz. ? ?History of Present Illness:  ? ?Tammy Johnston is traveling through [place] with her husband, who is a Naval architect. She reports a history of 3 prior MIs in [place]. She underwent LHC in 08/2010 which showed severe 1-vessel CAD s/p PCI/stenting to the LAD. She has had numerous ED visits over the years for varying aliments, including chest pain with numerous troponins being drawn and being negative. Most recent ischemic evaluation on 07/06/2020 showed no evidence of ischemia with a normal LVEF. Echo at that time showed a normal EF with trace MR. She was recently seen in the ED in [place] on 07/15/2021 with chest pain and ruled out. Note indicates she left AMA when IV narcotics were not recommended.  ? ?She presented to [facility] on 07/20/2021 with palpitations and chest pressure that radiated to the left arm. She was at rest when these symptoms started. No other associated symptoms. She reports adherence to her Xarelto and denies any symptoms of bleeding. In the ED, vitals have been stable. She has ruled out with two negative high sensitivity troponins. EKG showed sinus rhythm with no acute st/t changes. D dimer negative. CT chest showed mild right basilar linear atelectasis and a trace amount of pericardial fluid. CXR showed no acute  abnormalities. Tele has demonstrated sinus rhythm. In the ED, she has received IV fluids, Norco, Dilaudid Valium, and ASA. Currently, asymptomatic.  ? ? ?Past Medical History:  ?Diagnosis Date  ? MI, old   ? PE (pulmonary thromboembolism) (HCC)   ? Seizures (HCC)   ? Sickle cell anemia (HCC)   ? ? ?Past Surgical History:  ?Procedure Laterality Date  ? CORONARY ANGIOPLASTY WITH STENT PLACEMENT    ? kidney stent    ?  ? ?Home Meds: ?Prior to Admission medications   ?Medication Sig Start Date End Date Taking? Authorizing Provider  ?FLUoxetine (PROZAC) 40 MG capsule Take 80 mg by mouth daily. 06/23/21  Yes [provider]  ?HYDROcodone-acetaminophen (NORCO) 10-325 MG tablet Take 1-2 tablets by mouth as directed. Take 1-2 tablets every 4-6 hours as needed. 02/21/16  Yes [provider]  ?levETIRAcetam (KEPPRA) 750 MG tablet Take 1,500 mg by mouth 2 (two) times daily. 07/07/21  Yes [provider]  ?prazosin (MINIPRESS) 5 MG capsule Take 5 mg by mouth at bedtime. 06/23/21  Yes [provider]  ?propranolol (INDERAL) 40 MG tablet Take 40 mg by mouth 2 (two) times daily as needed. 07/04/21  Yes [provider]  ?QUEtiapine (SEROQUEL XR) 300 MG 24 hr tablet Take 300 mg by mouth at bedtime. 07/04/21  Yes [provider]  ?XARELTO 10 MG TABS tablet Take 10 mg by mouth daily. 07/04/21  Yes [provider]  ?zolpidem (AMBIEN) 5 MG tablet Take 5 mg by mouth at bedtime. 07/07/21  Yes [provider]  ? ? ?  Inpatient Medications: ?Scheduled Meds: ? aspirin EC  81 mg Oral Daily  ? atorvastatin  40 mg Oral q1800  ? FLUoxetine  80 mg Oral Daily  ? levETIRAcetam  1,500 mg Oral BID  ? pantoprazole (PROTONIX) IV  40 mg Intravenous Q12H  ? prazosin  5 mg Oral QHS  ? QUEtiapine  300 mg Oral QHS  ? rivaroxaban  10 mg Oral Daily  ? sodium chloride flush  3 mL Intravenous Q12H  ? ?Continuous Infusions: ? sodium chloride    ? ?PRN Meds: ?sodium chloride, acetaminophen,  HYDROcodone-acetaminophen, nitroGLYCERIN, ondansetron (ZOFRAN) IV, sodium chloride flush ? ?Allergies:   ?Allergies  ?Allergen Reactions  ? Ketorolac Tromethamine Hives  ? Morphine Hives, Itching, Rash and Other (See Comments)  ?  Other reaction(s): Other, Other (See Comments), Unknown ?Pt states morphine has to be given with benadryl ?Other reaction(s): "HAS TO BE GIVEN WITH BENADRYL" ?Hives ?Can be given with benadryl ?Localized site itching during administration ?Localized site itching during administration ?Localized redness ?Pt reports her mother informed her she had a reaction to morphine in the past after hysterectomy. Pt told by mother her throat closed.  ?Other reaction(s): Other (See Comments) ?Localized redness ?Localized site itching during administration ?Pt reports her mother informed her she had a reaction to morphine in the past after hysterectomy. Pt told by mother her throat closed.  ?Localized site itching during administration ?hives ?Hives ?Can be given with benadryl ?Localized site itching during administration ?Localized redness ?Localized site itching during administration ?Pt reports her mother informed her she had a reaction to morphine in the past after hysterectomy. Pt told by mother her throat closed.  ?Other reaction(s): Other (See Comments) ?Localized redness ?Localized site itching during administration ?Pt reports her mother informed her she had a reaction to morphine in the past after hysterectomy. Pt told by mother her throat closed.  ?Localized site itching during administration ?hives ?  ? Tramadol Hives and Rash  ?  Other reaction(s): Hallucinations, Unknown, Unknown ?Hallucinations ?hives ?Hallucinations ?Hallucinations ?hives ?Hallucinations ?  ? Fentanyl   ?  hives  ? Ketorolac Hives  ?  Other reaction(s): Unknown ?hives, From TORADOL ?  ? Tramadol-Acetaminophen   ?  Other reaction(s): Other (see comments) ?Hallucinations  ? Ginger Hives and Other (See Comments)  ?  Other  reaction(s): Other (see comments), Unknown ?Other reaction(s): Unknown ?Other reaction(s): Unknown ?Other reaction(s): Unknown ?  ? ? ?Social History:   ?Social History  ? ?Socioeconomic History  ? Marital status: Single  ?  Spouse name: Not on file  ? Number of children: Not on file  ? Years of education: Not on file  ? Highest education level: Not on file  ?Occupational History  ? Not on file  ?Tobacco Use  ? Smoking status: Never  ? Smokeless tobacco: Never  ?Substance and Sexual Activity  ? Alcohol use: Not on file  ? Drug use: Never  ? Sexual activity: Not on file  ?Other Topics Concern  ? Not on file  ?Social History Narrative  ? Not on file  ? ?Social Determinants of Health  ? ?Financial Resource Strain: Not on file  ?Food Insecurity: Not on file  ?Transportation Needs: Not on file  ?Physical Activity: Not on file  ?Stress: Not on file  ?Social Connections: Not on file  ?Intimate Partner Violence: Not on file  ?  ? ?Family History:   ?Family History  ?Problem Relation Age of Onset  ? Breast cancer Mother   ? Heart  disease Mother   ? ? ?ROS:  ?Review of Systems  ?Constitutional:  Positive for malaise/fatigue. Negative for chills, diaphoresis, fever and weight loss.  ?HENT:  Negative for congestion.   ?Eyes:  Negative for discharge and redness.  ?Respiratory:  Negative for cough, sputum production, shortness of breath and wheezing.   ?Cardiovascular:  Positive for chest pain and palpitations. Negative for orthopnea, claudication, leg swelling and PND.  ?Gastrointestinal:  Negative for abdominal pain, heartburn, nausea and vomiting.  ?Musculoskeletal:  Positive for joint pain. Negative for falls and myalgias.  ?Skin:  Negative for rash.  ?Neurological:  Positive for weakness. Negative for dizziness, tingling, tremors, sensory change, speech change, focal weakness and loss of consciousness.  ?Endo/Heme/Allergies:  Does not bruise/bleed easily.  ?Psychiatric/Behavioral:  Negative for substance abuse. The patient  is not nervous/anxious.   ?All other systems reviewed and are negative.   ? ?Physical Exam/Data:  ? ?Vitals:  ? 07/21/21 0430 07/21/21 0500 07/21/21 0530 07/21/21 0600  ?BP: (!) 133/91 136/82 (!) 143/87 132/85  ?Pulse: 73 69 72 7

## 2021-07-21 NOTE — Assessment & Plan Note (Signed)
Continue asa/ NTG/ oxygen/ morphine.  ?Statin.  ?Am lipid panel.  ?Chest ct done and negative dimer. ?

## 2021-07-21 NOTE — ED Notes (Signed)
Patient requesting her "night time medications" to help her sleep.  Tried to inform patient that medication for sleep could not be given at this time.  Floor coverage NP made aware ?

## 2021-07-21 NOTE — Consult Note (Signed)
Consultation ? ?Referring Provider:  Dr Posey Pronto   ?Admit date 07/20/21 ?Consult date      07/21/21   ?Reason for Consultation:     IDA ?       ? HPI:   ?Tammy Johnston is a 46 y.o. 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 ?Care everywhere shows multiple emergency room visits for several different health systems. PCP in [place], [place].  ?CBC 07/15/21 with hgb 9.2, microcytic in nature done at OSH- she has also had hgb ranging from 7.2-9.2 over the last year. States she was started on Fe at some point but unsure why. States she is craving ice.  Has had some right flank pain relating to ureteral stent and kidney stone issues but no other abdominal pain. Has history of chronic constipation likely related to her norco she takes for her AS but takes colace daily, occasionally uses a suppository or enema if she has more than a 2d window in between bms. Denies any melena/hematocehzia, bowel habit changes. Has been having recent problems with gerd. She is receiving IV pantoprazole bid here. Denies nsaids, tobacco, illicits, etoh. No known problems with sedated procedures. Husband is a Administrator and she travels with him. States she is unsure why she had PE in 2017. ? ?PREVIOUS ENDOSCOPIES:            ?Patient unsure if she has had any endoscopy/colonoscopy in past ? ?CT A/P 1/23 - no acute abnormalities. There was abundant stool in the colon ? ?Past Medical History:  ?Diagnosis Date  ? MI, old   ? PE (pulmonary thromboembolism) ([place])   ? Seizures ([place])   ? Sickle cell anemia (HCC)   ? ? ?Past Surgical History:  ?Procedure Laterality Date  ? CORONARY ANGIOPLASTY WITH STENT PLACEMENT    ? kidney stent    ? ? ?Family History  ?Problem Relation Age of Onset  ? Breast cancer Mother   ? Heart disease Mother   ?  ? ?Social History  ? ?Tobacco Use  ?  Smoking status: Never  ? Smokeless tobacco: Never  ?Substance Use Topics  ? Drug use: Never  ? ? ?Prior to Admission medications   ?Medication Sig Start Date End Date Taking? Authorizing Provider  ?FLUoxetine (PROZAC) 40 MG capsule Take 80 mg by mouth daily. 06/23/21  Yes [provider]  ?HYDROcodone-acetaminophen (NORCO) 10-325 MG tablet Take 1-2 tablets by mouth as directed. Take 1-2 tablets every 4-6 hours as needed. 02/21/16  Yes [provider]  ?levETIRAcetam (KEPPRA) 750 MG tablet Take 1,500 mg by mouth 2 (two) times daily. 07/07/21  Yes [provider]  ?prazosin (MINIPRESS) 5 MG capsule Take 5 mg by mouth at bedtime. 06/23/21  Yes [provider]  ?propranolol (INDERAL) 40 MG tablet Take 40 mg by mouth 2 (two) times daily as needed. 07/04/21  Yes [provider]  ?QUEtiapine (SEROQUEL XR) 300 MG 24 hr tablet Take 300 mg by mouth at bedtime. 07/04/21  Yes [provider]  ?XARELTO 10 MG TABS tablet Take 10 mg by mouth daily. 07/04/21  Yes [provider]  ?zolpidem (AMBIEN) 5 MG tablet Take 5 mg by mouth at bedtime. 07/07/21  Yes [provider]  ? ? ?Current Facility-Administered Medications  ?Medication Dose Route Frequency Provider Last Rate Last Admin  ? 0.9 %  sodium chloride infusion  250 mL Intravenous PRN Para Skeans, MD      ? acetaminophen (TYLENOL) tablet 650 mg  650 mg Oral Q4H PRN Para Skeans, MD      ? aspirin EC tablet 81 mg  81 mg Oral Daily Para Skeans, MD   81 mg at 07/21/21 [id number]  ? atorvastatin (LIPITOR) tablet 40 mg  40 mg Oral q1800 Para Skeans, MD      ? FLUoxetine (PROZAC) capsule 80 mg  80 mg Oral Daily Foust, Katy L, NP   80 mg at 07/21/21 0929  ? HYDROcodone-acetaminophen (NORCO) 10-325 MG per tablet 1 tablet  1 tablet Oral Q6H PRN Foust, Katy L, NP   1 tablet at 07/21/21 1020  ? levETIRAcetam (KEPPRA) tablet 1,500 mg  1,500 mg Oral BID Foust, Katy L, NP   1,500 mg at 07/21/21 [id number]  ? nitroGLYCERIN (NITROSTAT)  SL tablet 0.4 mg  0.4 mg Sublingual Q5 Min x 3 PRN Para Skeans, MD      ? ondansetron (ZOFRAN) injection 4 mg  4 mg Intravenous Q6H PRN Para Skeans, MD      ? pantoprazole (PROTONIX) injection 40 mg  40 mg Intravenous Q12H Para Skeans, MD   40 mg at 07/21/21 1204  ? prazosin (MINIPRESS) capsule 5 mg  5 mg Oral QHS Foust, Katy L, NP      ? QUEtiapine (SEROQUEL XR) 24 hr tablet 300 mg  300 mg Oral QHS Foust, Katy L, NP      ? rivaroxaban (XARELTO) tablet 10 mg  10 mg Oral Daily Foust, Katy L, NP      ? sodium chloride flush (NS) 0.9 % injection 3 mL  3 mL Intravenous Q12H Florina Ou V, MD      ? sodium chloride flush (NS) 0.9 % injection 3 mL  3 mL Intravenous PRN Para Skeans, MD      ? ?Current Outpatient Medications  ?Medication Sig Dispense Refill  ? FLUoxetine (PROZAC) 40 MG capsule Take 80 mg by mouth daily.    ? HYDROcodone-acetaminophen (NORCO) 10-325 MG tablet Take 1-2 tablets by mouth as directed. Take 1-2 tablets every 4-6 hours as needed.    ? levETIRAcetam (KEPPRA) 750 MG tablet Take 1,500 mg by mouth 2 (two) times daily.    ? prazosin (MINIPRESS) 5 MG capsule Take 5 mg by mouth at bedtime.    ? propranolol (INDERAL) 40 MG tablet Take 40 mg by mouth 2 (two) times daily as needed.    ? QUEtiapine (SEROQUEL XR) 300 MG 24 hr tablet Take 300 mg by mouth at bedtime.    ? XARELTO 10 MG TABS tablet Take 10 mg by mouth daily.    ? zolpidem (AMBIEN) 5 MG tablet Take 5 mg by mouth at bedtime.    ? ? ?Allergies as of 07/20/2021 - Review Complete 07/20/2021  ?Allergen Reaction Noted  ? Morphine Hives 07/20/2021  ? ? ? ?Review of Systems:    ?All systems reviewed and negative except where noted in HPI. ? ?Gen: Denies any fever, chills, sweats, anorexia, fatigue, weakness, malaise, weight loss, and sleep disorder ?CV: Denies chest pain, angina, palpitations, syncope, orthopnea, PND, peripheral edema, and claudication. ?Resp: Denies dyspnea at rest, dyspnea with exercise, cough, sputum, wheezing, coughing up  blood, and pleurisy. ?GI: Denies vomiting blood, jaundice, and fecal incontinence.   Denies dysphagia or odynophagia. ?GU : Denies urinary burning, blood in urine, urinary frequency, urinary hesitancy, nocturnal urination, and urinary incontinence. ?  MS: Denies joint pain, limitation of movement, and swelling, stiffness, low back pain, extremity pain. Denies muscle weakness, cramps, atrophy.  ?Derm: Denies rash, itching, dry skin, hives, moles, warts, or unhealing ulcers.  ?Psych: Denies depression, anxiety, memory loss, suicidal ideation, hallucinations, paranoia, and confusion. ?Heme: Denies bruising, bleeding, and enlarged lymph nodes. ?Neuro:  Denies any headaches, dizziness, paresthesias. ?Endo:  Denies any problems with DM, thyroid, adrenal function. ? ? ? Physical Exam:  ?Vital signs in last 24 hours: ?Temp:  [98 ?F (36.7 ?C)-98.8 ?F (37.1 ?C)] 98.6 ?F (37 ?C) (05/15 1200) ?Pulse Rate:  [62-94] 76 (05/15 1300) ?Resp:  [12-24] 13 (05/15 1300) ?BP: (112-158)/(74-107) 150/106 (05/15 1300) ?SpO2:  [95 %-100 %] 95 % (05/15 1300) ?Weight:  [62.6 kg] 62.6 kg (05/14 2121) ?  ?General:   Pleasant woman in NAD ?Head:  Normocephalic and atraumatic. ?Eyes:   No icterus.   Conjunctiva pink. ?Ears:  Normal auditory acuity. ?Mouth: Mucosa pink moist, no lesions. ?Neck:  Supple; no masses felt ?Lungs:  Respirations even and unlabored. Lungs clear to auscultation bilaterally.   No wheezes, crackles, or rhonchi.  ?Heart:  S1S2, RRR, no MRG. No edema. ?Abdomen:   Flat, soft, nondistended, nontender. Normal bowel sounds. No appreciable masses or hepatomegaly. No rebound signs or other peritoneal signs. ?Rectal:  Not performed.  ?Msk:  MAEW x4, No clubbing or cyanosis. Strength 5/5. Symmetrical without gross deformities. ?Neurologic:  Alert and  oriented x4;  Cranial nerves II-XII intact.  ?Skin:  Warm, dry, pink without significant lesions or rashes. ?Psych:  Alert and cooperative. Normal affect. ? ?LAB RESULTS: ?Recent Labs  ?   07/20/21 ?2136  ?WBC 5.9  ?HGB 7.4*  ?HCT 22.5*  ?PLT 115*  ? ?BMET ?Recent Labs  ?  07/20/21 ?2136  ?NA 143  ?K 3.6  ?CL 116*  ?CO2 22  ?GLUCOSE 98  ?BUN 11  ?CREATININE 0.97  ?CALCIUM 8.7*  ? ?LFT ?Recen

## 2021-07-21 NOTE — Assessment & Plan Note (Signed)
Blood pressure (!) 132/97, pulse 77, temperature 98.8 ?F (37.1 ?C), temperature source Oral, resp. rate (!) 21, weight 62.6 kg, SpO2 95 %. ?resume home meds once med rec is available.  ? ?

## 2021-07-21 NOTE — Progress Notes (Signed)
?PROGRESS NOTE ? ? ? ?Tammy Johnston  AJO:[id number] DOB: 08/04/1975 DOA: 07/20/2021 ?PCP: Pcp, No  ? ? ?Brief Narrative:  ?Tammy Johnston is a 46 y.o. female with medical history significant of seizure disorder, CHF, heart disease, history of PE and DVT ankylosing spondylitis,, history of dysarthria with a stroke work-up that was negative for an MRI, chronic anemia.  Patient also has past medical history of epilepsy, pancreatitis, scoliosis, spondylosis comes with chest pain.  ?Found with be iron def. Anemia, s/p 1 unit prbc.  ? ?5/15 hematology and gi consulted. 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  Does not have.  Says she has had hysterectomy.  Complaining of chest pain and asking for Dilaudid ? ?Consultants:  ?Cardiology, hematology, GI ? ?Procedures:  ? ?Antimicrobials:  ?  ? ? ?Subjective: ?No shortness of breath at rest but has noticed she has become short of breath recently. ? ?Objective: ?Vitals:  ? 07/21/21 1120 07/21/21 1200 07/21/21 1230 07/21/21 1300  ?BP:  (!) 158/107 (!) 153/104 (!) 150/106  ?Pulse:  66 69 76  ?Resp:  12 13 13   ?Temp: 98.6 ?F (37 ?C) 98.6 ?F (37 ?C)    ?TempSrc: Oral Oral    ?SpO2:  100% 99% 95%  ?Weight:      ? ? ?Intake/Output Summary (Last 24 hours) at 07/21/2021 1514 ?Last data filed at 07/21/2021 1204 ?Gross per 24 hour  ?Intake 460 ml  ?Output --  ?Net 460 ml  ? ?Filed Weights  ? 07/20/21 2121  ?Weight: 62.6 kg  ? ? ?Examination: ?Calm, NAD ?Cta no w/r ?Reg s1/s2 no gallop ?Soft benign +bs ?No edema ?Aaoxox3  ?Mood and affect appropriate in current setting  ? ? ? ?Data Reviewed: I have personally reviewed following labs and imaging studies ? ?CBC: ?Recent Labs  ?Lab 07/20/21 ?2136  ?WBC 5.9  ?NEUTROABS 3.8  ?HGB 7.4*  ?HCT 22.5*  ?MCV 77.9*  ?PLT 115*  ? ?Basic Metabolic Panel: ?Recent Labs  ?Lab 07/20/21 ?2136  ?NA 143  ?K 3.6  ?CL 116*  ?CO2 22  ?GLUCOSE 98  ?BUN 11  ?CREATININE 0.97  ?CALCIUM 8.7*  ? ?GFR: ?CrCl cannot be calculated (Unknown ideal weight.). ?Liver Function  Tests: ?Recent Labs  ?Lab 07/20/21 ?2136  ?AST 37  ?ALT 29  ?ALKPHOS 101  ?BILITOT 0.8  ?PROT 6.1*  ?ALBUMIN 3.2*  ? ?No results for input(s): LIPASE, AMYLASE in the last 168 hours. ?No results for input(s): AMMONIA in the last 168 hours. ?Coagulation Profile: ?Recent Labs  ?Lab 07/20/21 ?2136  ?INR 1.1  ? ?Cardiac Enzymes: ?No results for input(s): CKTOTAL, CKMB, CKMBINDEX, TROPONINI in the last 168 hours. ?BNP (last 3 results) ?No results for input(s): PROBNP in the last 8760 hours. ?HbA1C: ?No results for input(s): HGBA1C in the last 72 hours. ?CBG: ?No results for input(s): GLUCAP in the last 168 hours. ?Lipid Profile: ?Recent Labs  ?  07/21/21 ?0617  ?CHOL 223*  ?HDL 58  ?LDLCALC 147*  ?TRIG 90  ?CHOLHDL 3.8  ? ?Thyroid Function Tests: ?Recent Labs  ?  07/21/21 ?0617  ?TSH 0.647  ?FREET4 0.68  ? ?Anemia Panel: ?Recent Labs  ?  07/20/21 ?2321  ?VITAMINB12 330  ?FOLATE 9.6  ?FERRITIN 7*  ?TIBC 398  ?IRON 18*  ?RETICCTPCT 1.5  ? ?Sepsis Labs: ?Recent Labs  ?Lab 07/20/21 ?2136 07/20/21 ?2322  ?LATICACIDVEN 0.9 1.0  ? ? ?No results found for this or any previous visit (from the past 240 hour(s)).  ? ? ? ? ? ?  Radiology Studies: ?CT CHEST W CONTRAST ? ?Result Date: 07/21/2021 ?CLINICAL DATA:  Chest pain. EXAM: CT CHEST WITH CONTRAST TECHNIQUE: Multidetector CT imaging of the chest was performed during intravenous contrast administration. RADIATION DOSE REDUCTION: This exam was performed according to the departmental dose-optimization program which includes automated exposure control, adjustment of the mA and/or kV according to patient size and/or use of iterative reconstruction technique. CONTRAST:  75mL OMNIPAQUE IOHEXOL 300 MG/ML  SOLN COMPARISON:  None Available. FINDINGS: Cardiovascular: No significant vascular findings. Normal heart size. A coronary artery stent is in place. A trace amount of pericardial fluid is seen. Mediastinum/Nodes: No enlarged mediastinal, hilar, or axillary lymph nodes. Thyroid gland,  trachea, and esophagus demonstrate no significant findings. Lungs/Pleura: Mild linear atelectasis is seen within the right lung base. There is no evidence of acute infiltrate, pleural effusion or pneumothorax. Upper Abdomen: No acute abnormality. Musculoskeletal: No chest wall abnormality. No acute or significant osseous findings. IMPRESSION: 1. Mild right basilar linear atelectasis. 2. Trace amount of pericardial fluid. Electronically Signed   By: Aram Candelahaddeus  Houston M.D.   On: 07/21/2021 00:18  ? ?DG Chest Portable 1 View ? ?Result Date: 07/20/2021 ?CLINICAL DATA:  Chest pain and shortness of breath, initial encounter EXAM: PORTABLE CHEST 1 VIEW COMPARISON:  None Available. FINDINGS: Cardiac shadow is within normal limits. Lungs are well aerated bilaterally. No focal infiltrate or effusion is seen. Old rib fractures are noted on the right. IMPRESSION: No acute abnormality noted. Electronically Signed   By: Alcide CleverMark  Lukens M.D.   On: 07/20/2021 21:57  ? ?ECHOCARDIOGRAM COMPLETE ? ?Result Date: 07/21/2021 ?   ECHOCARDIOGRAM REPORT   Patient Name:   Tammy Johnston Date of Exam: 07/21/2021 Medical Rec #:  [id number]      Height: Accession #:    [id number]     Weight:       138.0 lb Date of Birth:  04/11/1975      BSA:          1.577 m? Patient Age:    45 years       BP:           135/82 mmHg Patient Gender: F              HR:           62 bpm. Exam Location:  [facility] Procedure: 2D Echo, Cardiac Doppler and Color Doppler Indications:     R07.9 Chest Pain  History:         Patient has no prior history of Echocardiogram examinations.                  CAD and Previous Myocardial Infarction, Cardiac stent, Renal                  artery stent; Risk Factors:Hypertension.  Sonographer:     Ceasar MonsJennifer Broe Referring Phys:  [id number] Eliezer MccoyEKTA V PATEL Diagnosing Phys: Lorine BearsMuhammad Arida MD  Sonographer Comments: Suboptimal apical window. Image acquisition challenging due to respiratory motion. IMPRESSIONS  1. Left ventricular ejection fraction, by  estimation, is 60 to 65%. The left ventricle has normal function. The left ventricle has no regional wall motion abnormalities. Left ventricular diastolic parameters were normal.  2. Right ventricular systolic function is normal. The right ventricular size is normal. Tricuspid regurgitation signal is inadequate for assessing PA pressure.  3. The mitral valve is normal in structure. No evidence of mitral valve regurgitation. No evidence of mitral stenosis.  4. The aortic valve is normal in  structure. Aortic valve regurgitation is not visualized. No aortic stenosis is present.  5. The inferior vena cava is normal in size with greater than 50% respiratory variability, suggesting right atrial pressure of 3 mmHg. FINDINGS  Left Ventricle: Left ventricular ejection fraction, by estimation, is 60 to 65%. The left ventricle has normal function. The left ventricle has no regional wall motion abnormalities. The left ventricular internal cavity size was normal in size. There is  borderline left ventricular hypertrophy. Left ventricular diastolic parameters were normal. Right Ventricle: The right ventricular size is normal. No increase in right ventricular wall thickness. Right ventricular systolic function is normal. Tricuspid regurgitation signal is inadequate for assessing PA pressure. Left Atrium: Left atrial size was normal in size. Right Atrium: Right atrial size was normal in size. Pericardium: There is no evidence of pericardial effusion. Mitral Valve: The mitral valve is normal in structure. No evidence of mitral valve regurgitation. No evidence of mitral valve stenosis. MV peak gradient, 2.3 mmHg. The mean mitral valve gradient is 1.0 mmHg. Tricuspid Valve: The tricuspid valve is normal in structure. Tricuspid valve regurgitation is not demonstrated. No evidence of tricuspid stenosis. Aortic Valve: The aortic valve is normal in structure. Aortic valve regurgitation is not visualized. No aortic stenosis is present.  Aortic valve mean gradient measures 3.0 mmHg. Aortic valve peak gradient measures 4.7 mmHg. Aortic valve area, by VTI measures 1.89 cm?. Pulmonic Valve: The pulmonic valve was normal in structure. Pulmonic valve regurgitati

## 2021-07-21 NOTE — ED Notes (Signed)
Patient requesting additional pain medication at this time.  No current PRN orders ?

## 2021-07-21 NOTE — ED Provider Notes (Signed)
Patient getting transfusion reports she had an episode of feeling funny and had a little chest discomfort.  EKG was done shows normal sinus rhythm rate of 65 normal axis EKG is essentially normal and looks essentially the same as the previous one.  We will monitor the patient sure she is doing okay.  She is not tachycardic febrile or tachypneic. ?  ?Arnaldo Natal, MD ?07/21/21 1014 ? ?

## 2021-07-22 ENCOUNTER — Observation Stay: Payer: Managed Care, Other (non HMO) | Admitting: Anesthesiology

## 2021-07-22 ENCOUNTER — Encounter
Admission: EM | Disposition: A | Discharge status: Home / Self Care | Payer: Self-pay | Source: Home / Self Care | Attending: Emergency Medicine

## 2021-07-22 ENCOUNTER — Encounter: Payer: Self-pay | Admitting: Internal Medicine

## 2021-07-22 DIAGNOSIS — I2699 Other pulmonary embolism without acute cor pulmonale: Secondary | ICD-10-CM | POA: Diagnosis not present

## 2021-07-22 DIAGNOSIS — Z765 Malingerer [conscious simulation]: Secondary | ICD-10-CM

## 2021-07-22 DIAGNOSIS — R079 Chest pain, unspecified: Secondary | ICD-10-CM

## 2021-07-22 DIAGNOSIS — D649 Anemia, unspecified: Secondary | ICD-10-CM

## 2021-07-22 DIAGNOSIS — G40909 Epilepsy, unspecified, not intractable, without status epilepticus: Secondary | ICD-10-CM | POA: Diagnosis not present

## 2021-07-22 DIAGNOSIS — I10 Essential (primary) hypertension: Secondary | ICD-10-CM | POA: Diagnosis not present

## 2021-07-22 HISTORY — PX: COLONOSCOPY: SHX5424

## 2021-07-22 HISTORY — PX: ESOPHAGOGASTRODUODENOSCOPY: SHX5428

## 2021-07-22 LAB — SICKLE CELL SCREEN: Sickle Cell Screen: POSITIVE — AB

## 2021-07-22 LAB — CBC
HCT: 30 % — ABNORMAL LOW (ref 36.0–46.0)
Hemoglobin: 10.2 g/dL — ABNORMAL LOW (ref 12.0–15.0)
MCH: 26.5 pg (ref 26.0–34.0)
MCHC: 34 g/dL (ref 30.0–36.0)
MCV: 77.9 fL — ABNORMAL LOW (ref 80.0–100.0)
Platelets: 130 10*3/uL — ABNORMAL LOW (ref 150–400)
RBC: 3.85 MIL/uL — ABNORMAL LOW (ref 3.87–5.11)
RDW: 18.5 % — ABNORMAL HIGH (ref 11.5–15.5)
WBC: 4.9 10*3/uL (ref 4.0–10.5)
nRBC: 0 % (ref 0.0–0.2)

## 2021-07-22 SURGERY — EGD (ESOPHAGOGASTRODUODENOSCOPY)
Anesthesia: General

## 2021-07-22 MED ORDER — SODIUM CHLORIDE 0.9 % IV SOLN
150.0000 mg | Freq: Once | INTRAVENOUS | Status: DC
  Filled 2021-07-22: qty 7.5

## 2021-07-22 MED ORDER — LIDOCAINE HCL (CARDIAC) PF 100 MG/5ML IV SOSY
PREFILLED_SYRINGE | INTRAVENOUS | Status: DC | PRN
  Administered 2021-07-22: 70 mg via INTRAVENOUS

## 2021-07-22 MED ORDER — SODIUM CHLORIDE 0.9 % IV SOLN: INTRAVENOUS | Status: DC

## 2021-07-22 MED ORDER — PROPOFOL 10 MG/ML IV BOLUS
INTRAVENOUS | Status: DC | PRN
Start: 2021-07-22 — End: 2021-07-22
  Administered 2021-07-22: 80 mg via INTRAVENOUS
  Administered 2021-07-22 (×3): 30 mg via INTRAVENOUS

## 2021-07-22 MED ORDER — PROPOFOL 10 MG/ML IV BOLUS
INTRAVENOUS | Status: AC
  Filled 2021-07-22: qty 40

## 2021-07-22 MED ORDER — PROPOFOL 500 MG/50ML IV EMUL
INTRAVENOUS | Status: DC | PRN
  Administered 2021-07-22: 90 ug/kg/min via INTRAVENOUS

## 2021-07-22 MED ORDER — LIDOCAINE HCL (PF) 2 % IJ SOLN
INTRAMUSCULAR | Status: AC
Start: 2021-07-22 — End: ?
  Filled 2021-07-22: qty 10

## 2021-07-22 MED ORDER — EPHEDRINE 5 MG/ML INJ
INTRAVENOUS | Status: AC
  Filled 2021-07-22: qty 5

## 2021-07-22 NOTE — Discharge Summary (Addendum)
Tammy Johnston BPJ:[id number] DOB: 1975-06-06 DOA: 07/20/2021 ? ?PCP: Pcp, No ? ?Admit date: 07/20/2021 ?Discharge date: 07/22/2021 ? ?Admitted From: home ?Disposition:  home ? ?Recommendations for Outpatient Follow-up:  ?Follow up with PCP in 1 week ?Please obtain BMP/CBC in one week ?F/u with GI and Hematologist at her home town [place] ? ? ? ? ? ? ?Discharge Condition:Stable ?CODE STATUS: Full ?Diet recommendation: Regular  ? ? ?Brief/Interim Summary: ?Per [facility]:KUVJDY Tammy Johnston is a 46 y.o. 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  history of dysarthria with a stroke work-up that was negative for an MRI, chronic anemia  presented to [facility] with chest pain .Marland Kitchen ?Patient also has past medical history of epilepsy, pancreatitis, scoliosis, spondylosis. ?Care everywhere shows multiple emergency room visits for several different health systems.  ? ?She underwent LHC in 08/2010 which showed severe 1-vessel CAD s/p PCI/stenting to the LAD. She has had numerous ED visits over the years for varying aliments, including chest pain with numerous troponins being drawn and being negative. Most recent ischemic evaluation on 07/06/2020 showed no evidence of ischemia with a normal LVEF. Echo at that time showed a normal EF with trace MR. She was recently seen in the ED in [place] on 07/15/2021 with chest pain and ruled out. Note indicates she left AMA when IV narcotics were not recommended. She has ruled out with two negative high sensitivity troponins. EKG showed sinus rhythm with no acute st/t changes. D dimer negative. CT chest showed mild right basilar linear atelectasis and a trace amount of pericardial fluid. CXR showed no acute abnormalities. Tele has demonstrated sinus rhythm ? ?She was found to be anemic and was given 1unit PRBC. Hematology ,  GI , and cardiology were consulted.  ? ?Did exhibit drug seeking behavior during her hospitalization. Today asked for IV Dilaudid again.  Since she is not receiving it she would like to be discharged.  She was supposed to get IV iron which she refused. ?Patient was told to f/u with all the doctors in her home town. She refused any refills of a/c and Rx for PPI. ? ? ?Chest pain ?Negative troponin, echo, EKG ?Atypical ?Cardiology consulted, does not require ischemic cardiac evaluation ?Follow-up with her cardiologist in 1 to 2 weeks ?  ?2. Iron deficient Anemia ?Receiving 1 unit PRBC  ?Hematology was consulted -recommended iron IV infusion however patient refused this today.   ?Patient reports having hysterectomy and no longer has menstrual cycle  ?GI was consulted patient underwent EGD and colonoscopy  ?EGD found with nonbleeding gastric ulcer with a clean ulcer base which was biopsied.  Recommend Protonix 40 mg p.o. daily x3 months ?No ibuprofen, naproxen or other NSAIDs ?Resume Xarelto in 2 days ?Colonoscopy was essentially normal exam of ileum.  Repeat colonoscopy as outpatient due to poor prep for screening purposes ?Patient refused prescription for Protonix and would like to just follow-up with her doctor in [place] for this. ? ? ?3. Essential Htn ?Continue home meds ?  ?4. H/xo seizure ds ?Continue Keppra ?  ?5. CAD ?Most recent ischemic evaluation on 07/06/2020 no evidence of ischemia with normal LVEF. ?On Xarelto in place of aspirin ?  ?6. Hx/o PE ?On Xarelto.  Offered patient prescription if she has run out and patient refused all scripts today. ?  ?7. Drug seeking behavior ?During her  hospitalization patient requested multiple times IV Dilaudid  for different reasons/pains. Has drug seeking behavior. ? ? ? ?Discharge Diagnoses:  ?Principal Problem: ?  Chest pain ?Active Problems: ?  Atherosclerotic heart disease of native coronary artery without angina pectoris ?  HTN (hypertension) ?  Seizure disorder  (HCC) ?  PE (pulmonary thromboembolism) (HCC) ?  Absolute anemia ?  Drug-seeking behavior ? ? ? ?Discharge Instructions ? ?Discharge Instructions   ? ? Diet - low sodium heart healthy   Complete by: As directed ?  ? Discharge instructions   Complete by: As directed ?  ? Resume Xarelto in 2 days 5/18 ?Need pantoprazole 40mg  bid for 3 months  ? Increase activity slowly   Complete by: As directed ?  ? ?  ? ?Allergies as of 07/22/2021   ? ?   Reactions  ? Ketorolac Tromethamine Hives  ? Morphine Hives, Itching, Rash, Other (See Comments)  ? Other reaction(s): Other, Other (See Comments), Unknown ?Pt states morphine has to be given with benadryl ?Other reaction(s): "HAS TO BE GIVEN WITH BENADRYL" ?Hives ?Can be given with benadryl ?Localized site itching during administration ?Localized site itching during administration ?Localized redness ?Pt reports her mother informed her she had a reaction to morphine in the past after hysterectomy. Pt told by mother her throat closed.  ?Other reaction(s): Other (See Comments) ?Localized redness ?Localized site itching during administration ?Pt reports her mother informed her she had a reaction to morphine in the past after hysterectomy. Pt told by mother her throat closed.  ?Localized site itching during administration ?hives ?Hives ?Can be given with benadryl ?Localized site itching during administration ?Localized redness ?Localized site itching during administration ?Pt reports her mother informed her she had a reaction to morphine in the past after hysterectomy. Pt told by mother her throat closed.  ?Other reaction(s): Other (See Comments) ?Localized redness ?Localized site itching during administration ?Pt reports her mother informed her she had a reaction to morphine in the past after hysterectomy. Pt told by mother her throat closed.  ?Localized site itching during administration ?hives  ? Tramadol Hives, Rash  ? Other reaction(s): Hallucinations, Unknown,  Unknown ?Hallucinations ?hives ?Hallucinations ?Hallucinations ?hives ?Hallucinations  ? Fentanyl   ? hives  ? Ketorolac Hives  ? Other reaction(s): Unknown ?hives, From TORADOL  ? Tramadol-acetaminophen   ? Other reaction(s): Other (see comments) ?Hallucinations  ? Ginger Hives, Other (See Comments)  ? Other reaction(s): Other (see comments), Unknown ?Other reaction(s): Unknown ?Other reaction(s): Unknown ?Other reaction(s): Unknown  ? ?  ? ?  ?Medication List  ?  ? ?TAKE these medications   ? ?FLUoxetine 40 MG capsule ?Commonly known as: PROZAC ?Take 80 mg by mouth daily. ?  ?HYDROcodone-acetaminophen 10-325 MG tablet ?Commonly known as: NORCO ?Take 1-2 tablets by mouth as directed. Take 1-2 tablets every 4-6 hours as needed. ?  ?levETIRAcetam 750 MG tablet ?Commonly known as: KEPPRA ?Take 1,500 mg by mouth 2 (two) times daily. ?  ?prazosin 5 MG capsule ?Commonly known as: MINIPRESS ?Take 5 mg by mouth at bedtime. ?  ?propranolol 40 MG tablet ?Commonly known as: INDERAL ?Take 40 mg by mouth 2 (two) times daily as needed. ?  ?QUEtiapine 300 MG 24 hr tablet ?Commonly known as: SEROQUEL XR ?Take 300 mg by mouth at bedtime. ?  ?Xarelto 10 MG Tabs tablet ?Generic drug: rivaroxaban ?Take 10 mg by mouth daily. ?  ?zolpidem 5 MG tablet ?Commonly known as: AMBIEN ?Take 5 mg by mouth at bedtime. ?  ? ?  ? ? ?  Allergies  ?Allergen Reactions  ? Ketorolac Tromethamine Hives  ? Morphine Hives, Itching, Rash and Other (See Comments)  ?  Other reaction(s): Other, Other (See Comments), Unknown ?Pt states morphine has to be given with benadryl ?Other reaction(s): "HAS TO BE GIVEN WITH BENADRYL" ?Hives ?Can be given with benadryl ?Localized site itching during administration ?Localized site itching during administration ?Localized redness ?Pt reports her mother informed her she had a reaction to morphine in the past after hysterectomy. Pt told by mother her throat closed.  ?Other reaction(s): Other (See Comments) ?Localized  redness ?Localized site itching during administration ?Pt reports her mother informed her she had a reaction to morphine in the past after hysterectomy. Pt told by mother her throat closed.  ?Localized site itching during administratio

## 2021-07-22 NOTE — Anesthesia Postprocedure Evaluation (Signed)
Anesthesia Post Note ? ?Patient: Tammy Johnston ? ?Procedure(s) Performed: ESOPHAGOGASTRODUODENOSCOPY (EGD) ?COLONOSCOPY ? ?Patient location during evaluation: Endoscopy ?Anesthesia Type: General ?Level of consciousness: awake and alert ?Pain management: pain level controlled ?Vital Signs Assessment: post-procedure vital signs reviewed and stable ?Respiratory status: spontaneous breathing, nonlabored ventilation and respiratory function stable ?Cardiovascular status: blood pressure returned to baseline and stable ?Postop Assessment: no apparent nausea or vomiting ?Anesthetic complications: no ? ? ?No notable events documented. ? ? ?Last Vitals:  ?Vitals:  ? 07/22/21 1223 07/22/21 1243  ?BP: (!) 129/101 138/74  ?Pulse: 66 67  ?Resp: 20 17  ?Temp:  (!) 36.3 ?C  ?SpO2: 100% 100%  ?  ?Last Pain:  ?Vitals:  ? 07/22/21 1300  ?TempSrc:   ?PainSc: 9   ? ? ?  ?  ?  ?  ?  ?  ? ?Foye Deer ? ? ? ? ?

## 2021-07-22 NOTE — TOC Initial Note (Signed)
Transition of Care (TOC) - Initial/Assessment Note  ? ? ?Patient Details  ?Name: Tammy Johnston ?MRN: [id number] ?Date of Birth: 1975-05-17 ? ?Transition of Care (TOC) CM/SW Contact:    ?Truddie Hidden, RN ?Phone Number: ?07/22/2021, 3:24 PM ? ?Clinical Narrative:                 ? ?Transition of Care (TOC) Screening Note ? ? ?Patient Details  ?Name: Tammy Johnston ?Date of Birth: 21-Nov-1975 ? ? ?Transition of Care (TOC) CM/SW Contact:    ?Truddie Hidden, RN ?Phone Number: ?07/22/2021, 3:25 PM ? ? ? ?Transition of Care Department [facility]) has reviewed patient and no TOC needs have been identified at this time. We will continue to monitor patient advancement through interdisciplinary progression rounds. If new patient transition needs arise, please place a TOC consult. ? ? ? ?  ?  ? ? ?Patient Goals and CMS Choice ?  ?  ?  ? ?Expected Discharge Plan and Services ?  ?  ?  ?  ?  ?Expected Discharge Date: 07/22/21               ?  ?  ?  ?  ?  ?  ?  ?  ?  ?  ? ?Prior Living Arrangements/Services ?  ?  ?  ?       ?  ?  ?  ?  ? ?Activities of Daily Living ?  ?ADL Screening (condition at time of admission) ?Patient's cognitive ability adequate to safely complete daily activities?: Yes ?Is the patient deaf or have difficulty hearing?: No ?Does the patient have difficulty seeing, even when wearing glasses/contacts?: No ?Does the patient have difficulty concentrating, remembering, or making decisions?: No ?Patient able to express need for assistance with ADLs?: Yes ?Does the patient have difficulty dressing or bathing?: No ?Independently performs ADLs?: Yes (appropriate for developmental age) ?Does the patient have difficulty walking or climbing stairs?: No ?Weakness of Legs: None ?Weakness of Arms/Hands: None ? ?Permission Sought/Granted ?  ?  ?   ?   ?   ?   ? ?Emotional Assessment ?  ?  ?  ?  ?  ?  ? ?Admission diagnosis:  Nephrolithiasis [N20.0] ?Atypical chest pain [R07.89] ?Chest pain [R07.9] ?Acute on chronic anemia  [D64.9] ?Patient Active Problem List  ? Diagnosis Date Noted  ? Drug-seeking behavior 07/22/2021  ? Absolute anemia   ? Chest pain 07/21/2021  ? PE (pulmonary thromboembolism) (HCC) 07/21/2021  ? Intractable nausea and vomiting 02/22/2020  ? Ankylosing spondylitis (HCC) 09/06/2016  ? Acute pulmonary embolism (HCC) 01/02/2016  ? HTN (hypertension) 09/25/2015  ? Seizure disorder (HCC) 12/03/2014  ? History of seizure disorder 05/12/2014  ? Atherosclerotic heart disease of native coronary artery without angina pectoris 11/04/2012  ? ?PCP:  Pcp, No ?Pharmacy:  No Pharmacies Listed ? ? ? ?Social Determinants of Health (SDOH) Interventions ?  ? ?Readmission Risk Interventions ?   ? View : No data to display.  ?  ?  ?  ? ? ? ?

## 2021-07-22 NOTE — Plan of Care (Signed)
  Problem: Education: Goal: Knowledge of General Education information will improve Description Including pain rating scale, medication(s)/side effects and non-pharmacologic comfort measures Outcome: Progressing   Problem: Clinical Measurements: Goal: Ability to maintain clinical measurements within normal limits will improve Outcome: Progressing Goal: Will remain free from infection Outcome: Progressing Goal: Diagnostic test results will improve Outcome: Progressing Goal: Respiratory complications will improve Outcome: Progressing Goal: Cardiovascular complication will be avoided Outcome: Progressing   Problem: Activity: Goal: Risk for activity intolerance will decrease Outcome: Progressing   Problem: Elimination: Goal: Will not experience complications related to urinary retention Outcome: Progressing   Problem: Safety: Goal: Ability to remain free from injury will improve Outcome: Progressing   Problem: Skin Integrity: Goal: Risk for impaired skin integrity will decrease Outcome: Progressing   

## 2021-07-22 NOTE — Op Note (Signed)
[facility] ?Gastroenterology ?Patient Name: Tammy Johnston ?Procedure Date: 07/22/2021 11:23 AM ?MRN: [id number] ?Account #: [id number] ?Date of Birth: 01-13-1976 ?Admit Type: Outpatient ?Age: 45 ?Room: [facility] ENDO ROOM 1 ?Gender: Female ?Note Status: Finalized ?Instrument Name: Colonoscope [id number] ?Procedure:             Colonoscopy ?Indications:           Iron deficiency anemia ?Providers:             Andrey Farmer MD, MD ?Medicines:             Monitored Anesthesia Care ?Complications:         No immediate complications. ?Procedure:             Pre-Anesthesia Assessment: ?                       - Prior to the procedure, a History and Physical was  ?                       performed, and patient medications and allergies were  ?                       reviewed. The patient is competent. The risks and  ?                       benefits of the procedure and the sedation options and  ?                       risks were discussed with the patient. All questions  ?                       were answered and informed consent was obtained.  ?                       Patient identification and proposed procedure were  ?                       verified by the physician, the nurse, the  ?                       anesthesiologist, the anesthetist and the technician  ?                       in the endoscopy suite. Mental Status Examination:  ?                       alert and oriented. Airway Examination: normal  ?                       oropharyngeal airway and neck mobility. Respiratory  ?                       Examination: clear to auscultation. CV Examination:  ?                       normal. Prophylactic Antibiotics: The patient does not  ?                       require prophylactic antibiotics. Prior  ?  Anticoagulants: The patient has taken Xarelto  ?                       (rivaroxaban), last dose was 3 days prior to  ?                       procedure. ASA Grade Assessment: III - A patient  with  ?                       severe systemic disease. After reviewing the risks and  ?                       benefits, the patient was deemed in satisfactory  ?                       condition to undergo the procedure. The anesthesia  ?                       plan was to use monitored anesthesia care (MAC).  ?                       Immediately prior to administration of medications,  ?                       the patient was re-assessed for adequacy to receive  ?                       sedatives. The heart rate, respiratory rate, oxygen  ?                       saturations, blood pressure, adequacy of pulmonary  ?                       ventilation, and response to care were monitored  ?                       throughout the procedure. The physical status of the  ?                       patient was re-assessed after the procedure. ?                       After obtaining informed consent, the colonoscope was  ?                       passed under direct vision. Throughout the procedure,  ?                       the patient's blood pressure, pulse, and oxygen  ?                       saturations were monitored continuously. The  ?                       Colonoscope was introduced through the anus and  ?                       advanced to the the terminal ileum. The colonoscopy  ?  was somewhat difficult due to poor bowel prep. The  ?                       patient tolerated the procedure well. The quality of  ?                       the bowel preparation was poor. ?Findings: ?     The perianal and digital rectal examinations were normal. ?     The terminal ileum appeared normal. ?     The entire examined colon appeared normal on direct and retroflexion  ?     views. ?Impression:            - Preparation of the colon was poor. ?                       - The examined portion of the ileum was normal. ?                       - The entire examined colon is normal on direct and  ?                        retroflexion views. ?                       - No specimens collected. ?Recommendation:        - Return patient to hospital ward for possible  ?                       discharge same day. ?                       - Advance diet as tolerated. ?                       - Resume Xarelto (rivaroxaban) at prior dose in 2 days. ?                       - Repeat colonoscopy as an outpatient due to poor prep  ?                       for screening purposes. ?Procedure Code(s):     --- Professional --- ?                       [phone], Colonoscopy, flexible; diagnostic, including  ?                       collection of specimen(s) by brushing or washing, when  ?                       performed (separate procedure) ?Diagnosis Code(s):     --- Professional --- ?                       D50.9, Iron deficiency anemia, unspecified ?CPT copyright 2019 [employer]. All rights reserved. ?The codes documented in this report are preliminary and upon coder review may  ?be revised to meet current compliance requirements. ?Andrey Farmer MD, MD ?07/22/2021 12:09:52 PM ?Number of Addenda: 0 ?Note Initiated On: 07/22/2021 11:23 AM ?Scope Withdrawal  Time: 0 hours 5 minutes 32 seconds  ?Total Procedure Duration: 0 hours 12 minutes 48 seconds  ?Estimated Blood Loss:  Estimated blood loss: none. ?     [facility] ?

## 2021-07-22 NOTE — Op Note (Signed)
[facility] ?Gastroenterology ?Patient Name: Tammy Johnston ?Procedure Date: 07/22/2021 11:23 AM ?MRN: [id number] ?Account #: [id number] ?Date of Birth: 1975-04-18 ?Admit Type: Outpatient ?Age: 46 ?Room: [facility] ENDO ROOM 1 ?Gender: Female ?Note Status: Finalized ?Instrument Name: Upper Endoscope [id number] ?Procedure:             Upper GI endoscopy ?Indications:           Iron deficiency anemia ?Providers:             Andrey Farmer MD, MD ?Medicines:             Monitored Anesthesia Care ?Complications:         No immediate complications. Estimated blood loss:  ?                       Minimal. ?Procedure:             Pre-Anesthesia Assessment: ?                       - Prior to the procedure, a History and Physical was  ?                       performed, and patient medications and allergies were  ?                       reviewed. The patient is competent. The risks and  ?                       benefits of the procedure and the sedation options and  ?                       risks were discussed with the patient. All questions  ?                       were answered and informed consent was obtained.  ?                       Patient identification and proposed procedure were  ?                       verified by the physician, the nurse, the  ?                       anesthesiologist, the anesthetist and the technician  ?                       in the endoscopy suite. Mental Status Examination:  ?                       alert and oriented. Airway Examination: normal  ?                       oropharyngeal airway and neck mobility. Respiratory  ?                       Examination: clear to auscultation. CV Examination:  ?                       normal. Prophylactic Antibiotics: The patient does not  ?  require prophylactic antibiotics. Prior  ?                       Anticoagulants: The patient has taken Xarelto  ?                       (rivaroxaban), last dose was 3 days prior to  ?                        procedure. ASA Grade Assessment: III - A patient with  ?                       severe systemic disease. After reviewing the risks and  ?                       benefits, the patient was deemed in satisfactory  ?                       condition to undergo the procedure. The anesthesia  ?                       plan was to use monitored anesthesia care (MAC).  ?                       Immediately prior to administration of medications,  ?                       the patient was re-assessed for adequacy to receive  ?                       sedatives. The heart rate, respiratory rate, oxygen  ?                       saturations, blood pressure, adequacy of pulmonary  ?                       ventilation, and response to care were monitored  ?                       throughout the procedure. The physical status of the  ?                       patient was re-assessed after the procedure. ?                       After obtaining informed consent, the endoscope was  ?                       passed under direct vision. Throughout the procedure,  ?                       the patient's blood pressure, pulse, and oxygen  ?                       saturations were monitored continuously. The Endoscope  ?                       was introduced through the mouth, and advanced to the  ?  second part of duodenum. The upper GI endoscopy was  ?                       accomplished without difficulty. The patient tolerated  ?                       the procedure well. ?Findings: ?     The examined esophagus was normal. ?     One non-bleeding cratered gastric ulcer with a clean ulcer base (Forrest  ?     Class III) was found at the pylorus. The lesion was 3 mm in largest  ?     dimension. Biopsies were taken with a cold forceps for histology.  ?     Estimated blood loss was minimal. ?     The exam of the stomach was otherwise normal. ?     The examined duodenum was normal. ?Impression:            - Normal esophagus. ?                        - Non-bleeding gastric ulcer with a clean ulcer base  ?                       (Forrest Class III). Biopsied. ?                       - Normal examined duodenum. ?Recommendation:        - Await pathology results. ?                       - Perform a colonoscopy today. ?                       - Use Protonix (pantoprazole) 40 mg PO daily for 3  ?                       months. ?                       - No ibuprofen, naproxen, or other non-steroidal  ?                       anti-inflammatory drugs. ?Procedure Code(s):     --- Professional --- ?                       [phone], Esophagogastroduodenoscopy, flexible,  ?                       transoral; with biopsy, single or multiple ?Diagnosis Code(s):     --- Professional --- ?                       K25.9, Gastric ulcer, unspecified as acute or chronic,  ?                       without hemorrhage or perforation ?                       D50.9, Iron deficiency anemia, unspecified ?CPT copyright 2019 American Medical Association. All rights reserved. ?The codes documented in this report are preliminary and upon  coder review may  ?be revised to meet current compliance requirements. ?Andrey Farmer MD, MD ?07/22/2021 12:05:50 PM ?Number of Addenda: 0 ?Note Initiated On: 07/22/2021 11:23 AM ?Estimated Blood Loss:  Estimated blood loss was minimal. ?     [facility] ?

## 2021-07-22 NOTE — Transfer of Care (Signed)
Immediate Anesthesia Transfer of Care Note ? ?Patient: Tammy Johnston ? ?Procedure(s) Performed: ESOPHAGOGASTRODUODENOSCOPY (EGD) ?COLONOSCOPY ? ?Patient Location: PACU and Endoscopy Unit ? ?Anesthesia Type:General ? ?Level of Consciousness: awake, drowsy and patient cooperative ? ?Airway & Oxygen Therapy: Patient Spontanous Breathing ? ?Post-op Assessment: Report given to RN, Post -op Vital signs reviewed and stable and Patient moving all extremities ? ?Post vital signs: Reviewed and stable ? ?Last Vitals:  ?Vitals Value Taken Time  ?BP 119/67 07/22/21 1206  ?Temp 36.1 ?C 07/22/21 1203  ?Pulse 65 07/22/21 1206  ?Resp 17 07/22/21 1207  ?SpO2 100 % 07/22/21 1206  ?Vitals shown include unvalidated device data. ? ?Last Pain:  ?Vitals:  ? 07/22/21 1203  ?TempSrc: Temporal  ?PainSc: Asleep  ?   ? ?  ? ?Complications: No notable events documented. ?

## 2021-07-22 NOTE — Progress Notes (Signed)
Discharge instructions reviewed with patient including followup visits, new medications, and when to restart Xarelto.  Understanding was verbalized and all questions were answered.  IV removed without complication; patient tolerated well.  Patient discharged home via wheelchair in stable condition escorted by volunteer staff. ? ?

## 2021-07-22 NOTE — Anesthesia Preprocedure Evaluation (Addendum)
Anesthesia Evaluation  ?Patient identified by MRN, date of birth, ID band ?Patient awake ? ? ? ?Reviewed: ?Allergy & Precautions, NPO status , Patient's Chart, lab work & pertinent test results ? ?Airway ?Mallampati: III ? ?TM Distance: >3 FB ?Neck ROM: full ? ? ? Dental ? ?(+) Chipped ?  ?Pulmonary ? ?H/o pulmonary thromboembolism ?  ?Pulmonary exam normal ? ? ? ? ? ? ? Cardiovascular ?Exercise Tolerance: Good ?hypertension, Pt. on medications ?+ CAD, + Past MI and + Cardiac Stents  ?Normal cardiovascular exam ? ?Most recent ischemic evaluation on 07/06/2020 no evidence of ischemia with normal LVEF ?  ?Neuro/Psych ?Seizures -, Well Controlled,  PSYCHIATRIC DISORDERS Anxiety history of dysarthria with a stroke work-up that was negative ?TIA  ? GI/Hepatic ?Neg liver ROS, H/o pancreatitis ?  ?Endo/Other  ?negative endocrine ROS ? Renal/GU ?Renal diseaseright ureteral stent  ?negative genitourinary ?  ?Musculoskeletal ? ?(+) Arthritis , ankylosing spondylitis  ? Abdominal ?Normal abdominal exam  (+)   ?Peds ? Hematology ? ?(+) Blood dyscrasia, anemia , IDA   ?Anesthesia Other Findings ?S/p 1 unit prbc ? ?Spondylosis comes with chest pain ?-Negative troponin, echo, EKG ?Atypical ?Cardiology consulted, does not require ischemic cardiac evaluation ? ?CT Angio Chest Pulmonary Embolism W IV Contrast  ?Final Result  ?1. No CT evidence confirmatory for pulmonary embolism.  ?-Electronically Signed By: Boris Lown, MD  ?-Electronically Signed On: 07/06/2021 11:12 PM  ? ? 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 ?Past Surgical History: ?No date: CORONARY ANGIOPLASTY WITH STENT PLACEMENT ?No date: kidney stent ? ? ? ? Reproductive/Obstetrics ?negative OB ROS ? ?  ? ? ? ? ? ? ? ? ? ? ? ? ? ?  ?  ? ? ? ? ? ? ?Anesthesia Physical ?Anesthesia Plan ? ?ASA: 3 ? ?Anesthesia Plan: General   ? ?Post-op Pain Management: Minimal or no pain anticipated  ? ?Induction: Intravenous ? ?PONV Risk Score and Plan: Propofol infusion and TIVA ? ?Airway Management Planned: Natural Airway and Simple Face Mask ? ?Additional Equipment:  ? ?Intra-op Plan:  ? ?Post-operative Plan:  ? ?Informed Consent: I have reviewed the patients History and Physical, chart, labs and discussed the procedure including the risks, benefits and alternatives for the proposed anesthesia with the patient or authorized representative who has indicated his/her understanding and acceptance.  ? ? ? ?Dental Advisory Given ? ?Plan Discussed with: Anesthesiologist, CRNA and Surgeon ? ?Anesthesia Plan Comments:   ? ? ? ? ?Anesthesia Quick Evaluation ? ?

## 2021-07-23 LAB — BPAM RBC
Blood Product Expiration Date: 202305242359
Blood Product Expiration Date: 202305282359
Blood Product Expiration Date: 202305292359
Blood Product Expiration Date: 202306012359
Blood Product Expiration Date: 202306072359
Blood Product Expiration Date: 202306072359
Blood Product Expiration Date: 202306122359
ISSUE DATE / TIME: 202305150829
Unit Type and Rh: 5100
Unit Type and Rh: 5100
Unit Type and Rh: 5100
Unit Type and Rh: 9500
Unit Type and Rh: 9500
Unit Type and Rh: 9500
Unit Type and Rh: 9500

## 2021-07-23 LAB — TYPE AND SCREEN
ABO/RH(D): B POS
Antibody Screen: POSITIVE
Donor AG Type: NEGATIVE
Unit division: 0
Unit division: 0
Unit division: 0
Unit division: 0
Unit division: 0
Unit division: 0
Unit division: 0

## 2021-07-23 LAB — SURGICAL PATHOLOGY

## 2021-07-24 ENCOUNTER — Encounter: Payer: Self-pay | Admitting: Gastroenterology

## 2023-02-27 IMAGING — CT CT ABD-PELV W/O CM
2 of 4 series · 15 of 46 positions shown, 17 images · non-contrast
Comparison: None Available.

CLINICAL DATA: Flank pain



[Series 2: ap without · axial · non-contrast · 0.67mm/px · z∈[-1142,-682]mm · 12 of 106 slices shown, 14 images]
[im 7/106  soft-tissue]
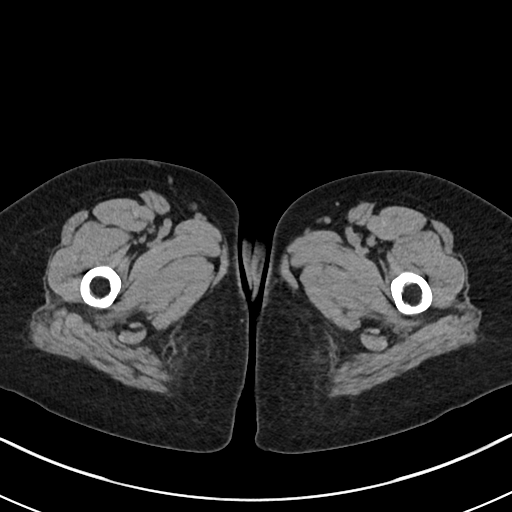
[im 7/106  bone]
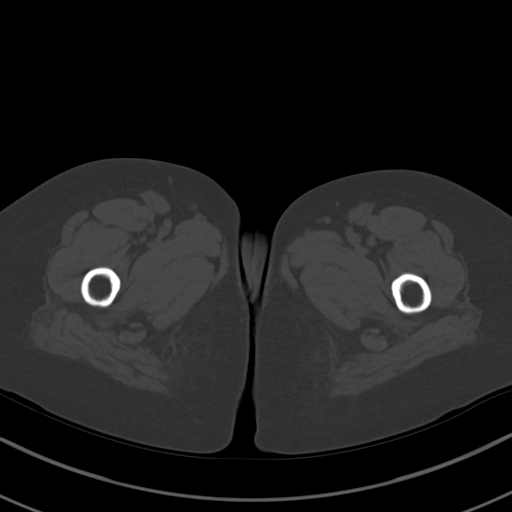
[im 19/106  soft-tissue]
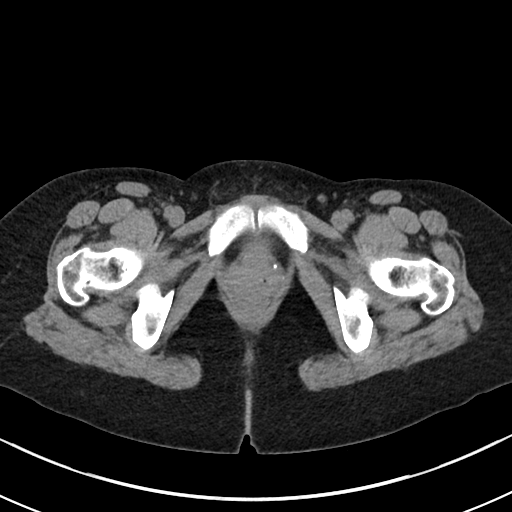
[im 25/106  soft-tissue]
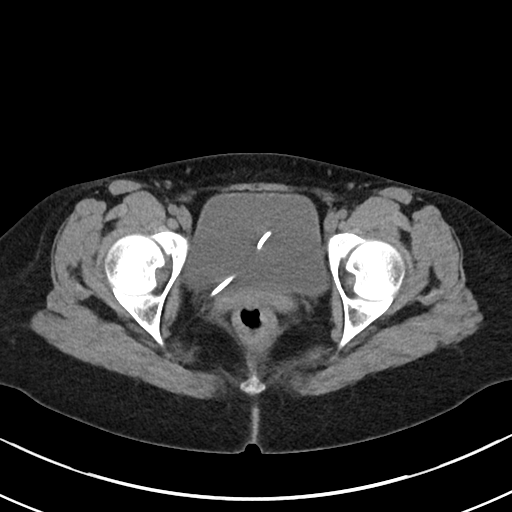
[im 31/106  soft-tissue]
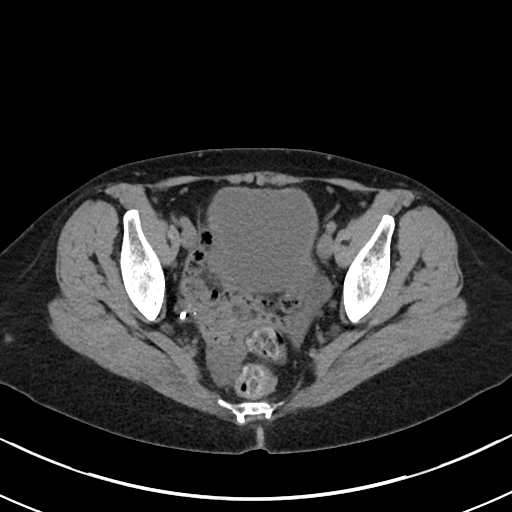
[im 44/106  soft-tissue]
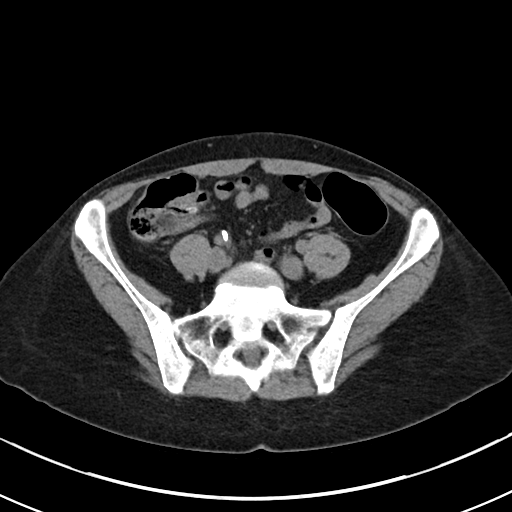
[im 50/106  soft-tissue]
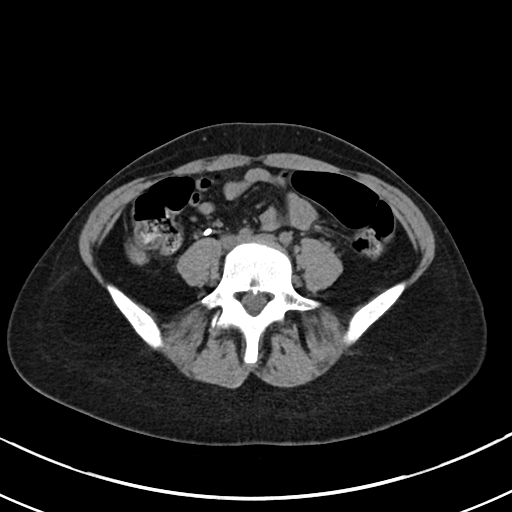
[im 56/106  soft-tissue]
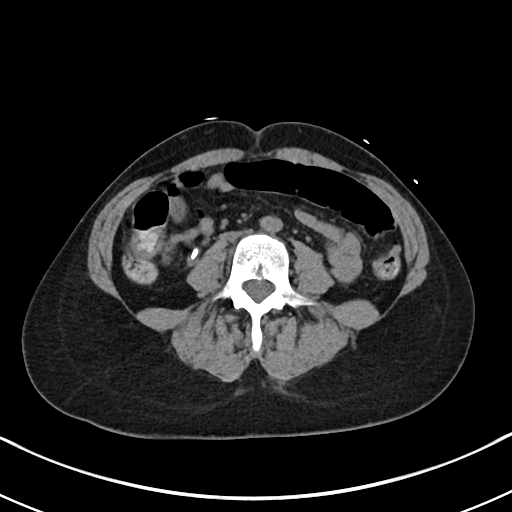
[im 68/106  soft-tissue]
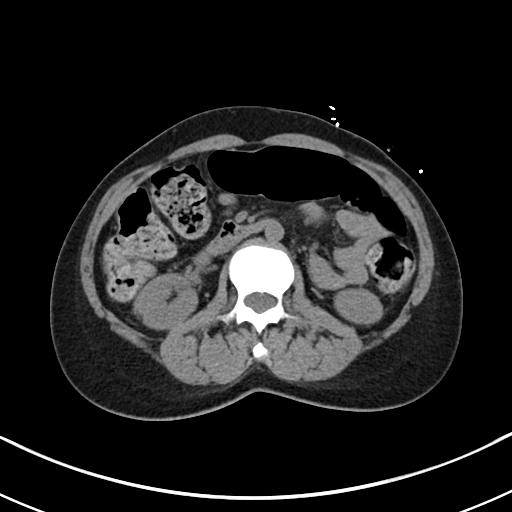
[im 75/106  soft-tissue]
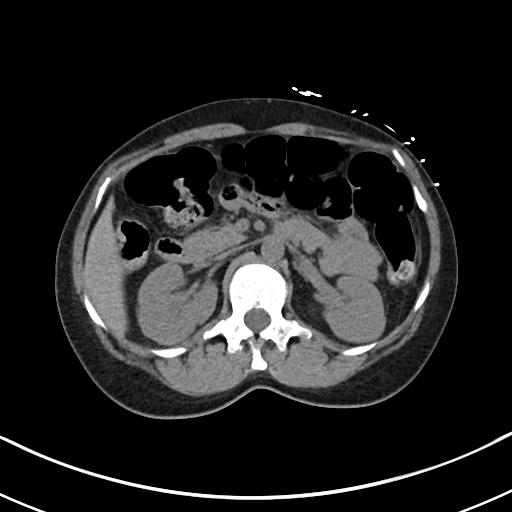
[im 75/106  bone]
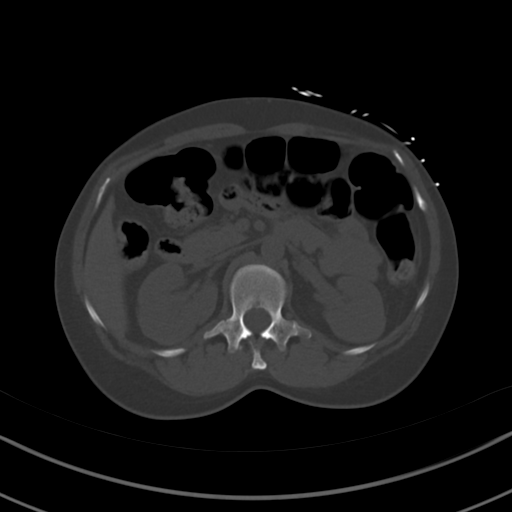
[im 81/106  soft-tissue]
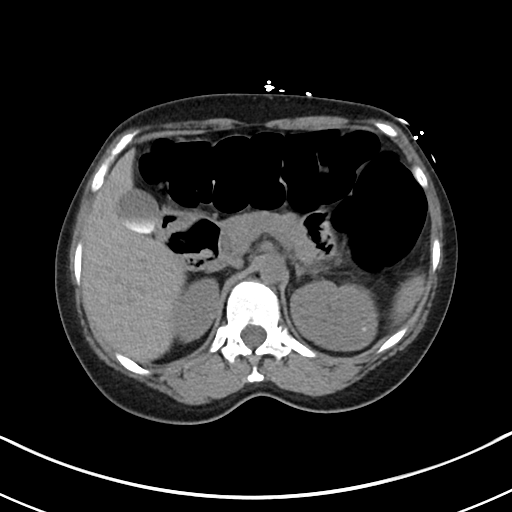
[im 93/106  soft-tissue]
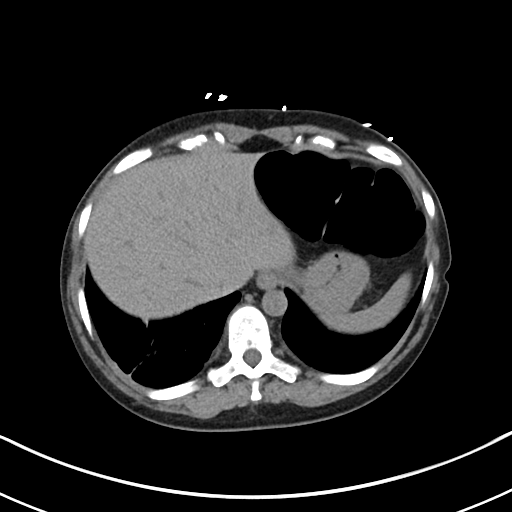
[im 99/106  soft-tissue]
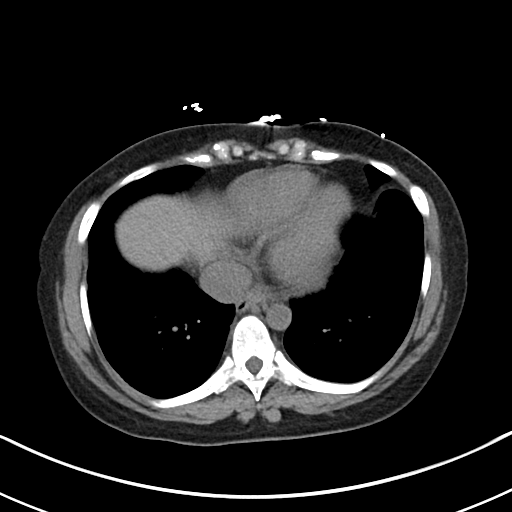

[Series 5: cor · coronal · 0.65mm/px · 3 of 81 slices shown]
[im 27/81  soft-tissue]
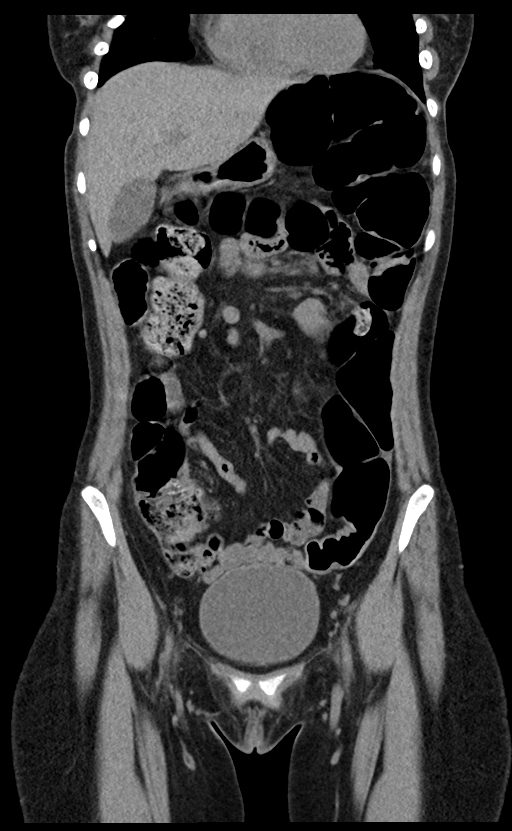
[im 36/81  soft-tissue]
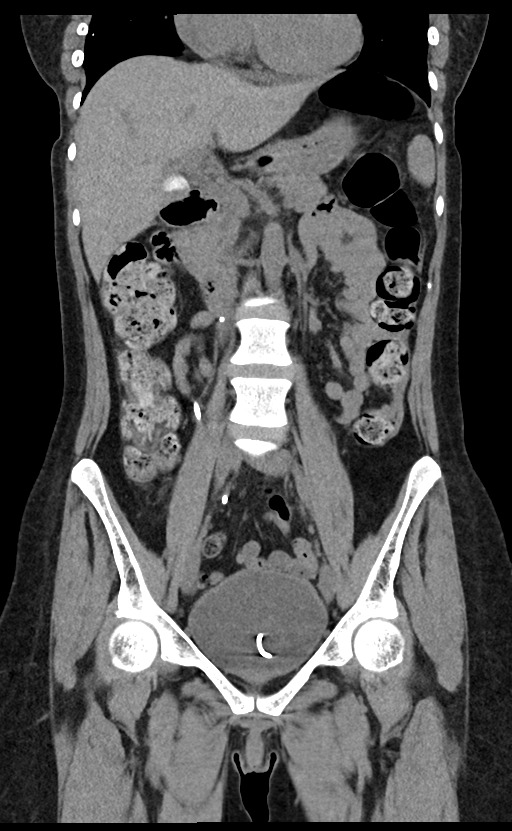
[im 45/81  soft-tissue]
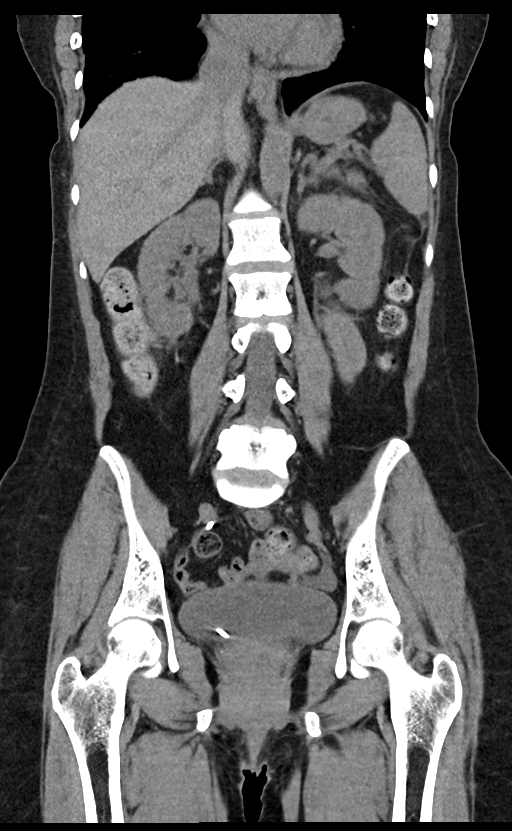

[15 of 46 positions shown; findings below may reference images not displayed]

FINDINGS: Lower chest: Mild linear scarring/atelectasis in the right lower
lobe.

Hepatobiliary: Unenhanced liver is unremarkable.

Vicarious excretion of contrast in the gallbladder (series 2/image
26). No intrahepatic or extrahepatic ductal dilatation.

Pancreas: Within normal limits.

Spleen: Within normal limits.

Adrenals/Urinary Tract: Adrenal glands are within normal limits.

Kidneys are within normal limits. No hydronephrosis. Right
double-pigtail ureteral stent. Proximal pigtail is in the proximal
ureter at the L3 level. Distal pigtail satisfactorily positioned in
the bladder.

Calcification adjacent to the right ureter/ureteral stent is favored
to be a pelvic phlebolith (series 2/image 52). Additional
phleboliths on image 58 and in the pelvis bilaterally.

Bladder is within normal limits.

Stomach/Bowel: Stomach is within normal limits.

No evidence of bowel obstruction.

Normal appendix (series 2/image 81).

No colonic wall thickening or inflammatory changes.

Vascular/Lymphatic: No evidence of abdominal aortic aneurysm.

No suspicious abdominopelvic lymphadenopathy.

Reproductive: Uterus is within normal limits.

No adnexal masses.

Other: Small volume pelvic ascites.

Musculoskeletal: Visualized osseous structures are within normal
limits.
IMPRESSION: Low-lying right ureteral stent. Proximal pigtail is in the proximal
ureter at the L3 level. No hydronephrosis.
